# Patient Record
Sex: Female | Born: 1975
Health system: Southern US, Community
[De-identification: ages and names within clinical notes are randomized; demographics above are authoritative.]

## PROBLEM LIST (undated history)

## (undated) HISTORY — PX: TUBAL LIGATION: SHX77

## (undated) HISTORY — PX: LAPAROSCOPIC GASTRIC SLEEVE RESECTION: SHX5895

---

## 1998-05-10 ENCOUNTER — Encounter: Payer: Self-pay | Admitting: Emergency Medicine

## 1998-05-10 ENCOUNTER — Emergency Department (HOSPITAL_COMMUNITY): Admission: EM | Admit: 1998-05-10 | Discharge: 1998-05-10 | Payer: Self-pay

## 1998-05-12 ENCOUNTER — Encounter: Admission: RE | Admit: 1998-05-12 | Discharge: 1998-05-12 | Payer: Self-pay | Admitting: *Deleted

## 2013-12-21 ENCOUNTER — Encounter (HOSPITAL_BASED_OUTPATIENT_CLINIC_OR_DEPARTMENT_OTHER): Payer: Self-pay | Admitting: Emergency Medicine

## 2013-12-21 ENCOUNTER — Emergency Department (HOSPITAL_BASED_OUTPATIENT_CLINIC_OR_DEPARTMENT_OTHER)
Admission: EM | Admit: 2013-12-21 | Discharge: 2013-12-21 | Disposition: A | Discharge status: Home / Self Care | Payer: Medicaid Other | Attending: Emergency Medicine | Admitting: Emergency Medicine

## 2013-12-21 DIAGNOSIS — R51 Headache: Secondary | ICD-10-CM | POA: Insufficient documentation

## 2013-12-21 DIAGNOSIS — I10 Essential (primary) hypertension: Secondary | ICD-10-CM | POA: Diagnosis not present

## 2013-12-21 DIAGNOSIS — F411 Generalized anxiety disorder: Secondary | ICD-10-CM | POA: Insufficient documentation

## 2013-12-21 DIAGNOSIS — Z3202 Encounter for pregnancy test, result negative: Secondary | ICD-10-CM | POA: Insufficient documentation

## 2013-12-21 DIAGNOSIS — H5710 Ocular pain, unspecified eye: Secondary | ICD-10-CM | POA: Diagnosis not present

## 2013-12-21 DIAGNOSIS — R519 Headache, unspecified: Secondary | ICD-10-CM

## 2013-12-21 LAB — PREGNANCY, URINE: Preg Test, Ur: NEGATIVE

## 2013-12-21 MED ORDER — IBUPROFEN 800 MG PO TABS
800.0000 mg | ORAL_TABLET | Freq: Once | ORAL | Status: AC
  Administered 2013-12-21: 800 mg via ORAL
  Filled 2013-12-21: qty 1

## 2013-12-21 MED ORDER — KETOROLAC TROMETHAMINE 30 MG/ML IJ SOLN: 30.0000 mg | Freq: Once | INTRAMUSCULAR | Status: DC

## 2013-12-21 MED ORDER — IBUPROFEN 800 MG PO TABS: 800.0000 mg | ORAL_TABLET | Freq: Three times a day (TID) | ORAL | Status: DC | PRN

## 2013-12-21 MED ORDER — SODIUM CHLORIDE 0.9 % IV BOLUS (SEPSIS): 1000.0000 mL | Freq: Once | INTRAVENOUS | Status: DC

## 2013-12-21 MED ORDER — METOCLOPRAMIDE HCL 5 MG/ML IJ SOLN: 10.0000 mg | Freq: Once | INTRAMUSCULAR | Status: DC

## 2013-12-21 MED ORDER — DIPHENHYDRAMINE HCL 50 MG/ML IJ SOLN: 12.5000 mg | Freq: Once | INTRAMUSCULAR | Status: DC

## 2013-12-21 NOTE — Discharge Instructions (Signed)
Your blood pressure today may be elevated secondary to your pain. I do not feel you need to be started on blood pressure medication at this time. Recommend that you check your blood pressure when you are feeling well several times over the next 2 weeks and then followup with your primary care physician to discuss your blood pressure and whether or not you should be started on medication. If you develop worsening headache, sudden onset headache, numbness or weakness in your arms or legs, changes in your vision, headache with fever and neck pain and stiffness, please return to the emergency department.   Migraine Headache A migraine headache is an intense, throbbing pain on one or both sides of your head. A migraine can last for 30 minutes to several hours. CAUSES  The exact cause of a migraine headache is not always known. However, a migraine may be caused when nerves in the brain become irritated and release chemicals that cause inflammation. This causes pain. Certain things may also trigger migraines, such as:  Alcohol.  Smoking.  Stress.  Menstruation.  Aged cheeses.  Foods or drinks that contain nitrates, glutamate, aspartame, or tyramine.  Lack of sleep.  Chocolate.  Caffeine.  Hunger.  Physical exertion.  Fatigue.  Medicines used to treat chest pain (nitroglycerine), birth control pills, estrogen, and some blood pressure medicines. SIGNS AND SYMPTOMS  Pain on one or both sides of your head.  Pulsating or throbbing pain.  Severe pain that prevents daily activities.  Pain that is aggravated by any physical activity.  Nausea, vomiting, or both.  Dizziness.  Pain with exposure to bright lights, loud noises, or activity.  General sensitivity to bright lights, loud noises, or smells. Before you get a migraine, you may get warning signs that a migraine is coming (aura). An aura may include:  Seeing flashing lights.  Seeing bright spots, halos, or zig-zag  lines.  Having tunnel vision or blurred vision.  Having feelings of numbness or tingling.  Having trouble talking.  Having muscle weakness. DIAGNOSIS  A migraine headache is often diagnosed based on:  Symptoms.  Physical exam.  A CT scan or MRI of your head. These imaging tests cannot diagnose migraines, but they can help rule out other causes of headaches. TREATMENT Medicines may be given for pain and nausea. Medicines can also be given to help prevent recurrent migraines.  HOME CARE INSTRUCTIONS  Only take over-the-counter or prescription medicines for pain or discomfort as directed by your health care provider. The use of long-term narcotics is not recommended.  Lie down in a dark, quiet room when you have a migraine.  Keep a journal to find out what may trigger your migraine headaches. For example, write down:  What you eat and drink.  How much sleep you get.  Any change to your diet or medicines.  Limit alcohol consumption.  Quit smoking if you smoke.  Get 7 9 hours of sleep, or as recommended by your health care provider.  Limit stress.  Keep lights dim if bright lights bother you and make your migraines worse. SEEK IMMEDIATE MEDICAL CARE IF:   Your migraine becomes severe.  You have a fever.  You have a stiff neck.  You have vision loss.  You have muscular weakness or loss of muscle control.  You start losing your balance or have trouble walking.  You feel faint or pass out.  You have severe symptoms that are different from your first symptoms. MAKE SURE YOU:   Understand  these instructions.  Will watch your condition.  Will get help right away if you are not doing well or get worse. Document Released: 08/12/2005 Document Revised: 06/02/2013 Document Reviewed: 04/19/2013 [facility] Patient Information 2014 [place], [place].  Managing Your High Blood Pressure Blood pressure is a measurement of how forceful your blood is pressing against the  walls of the arteries. Arteries are muscular tubes within the circulatory system. Blood pressure does not stay the same. Blood pressure rises when you are active, excited, or nervous; and it lowers during sleep and relaxation. If the numbers measuring your blood pressure stay above normal most of the time, you are at risk for health problems. High blood pressure (hypertension) is a long-term (chronic) condition in which blood pressure is elevated. A blood pressure reading is recorded as two numbers, such as 120 over 80 (or 120/80). The first, higher number is called the systolic pressure. It is a measure of the pressure in your arteries as the heart beats. The second, lower number is called the diastolic pressure. It is a measure of the pressure in your arteries as the heart relaxes between beats.  Keeping your blood pressure in a normal range is important to your overall health and prevention of health problems, such as heart disease and stroke. When your blood pressure is uncontrolled, your heart has to work harder than normal. High blood pressure is a very common condition in adults because blood pressure tends to rise with age. Men and women are equally likely to have hypertension but at different times in life. Before age 36, men are more likely to have hypertension. After 36 years of age, women are more likely to have it. Hypertension is especially common in African Americans. This condition often has no signs or symptoms. The cause of the condition is usually not known. Your caregiver can help you come up with a plan to keep your blood pressure in a normal, healthy range. BLOOD PRESSURE STAGES Blood pressure is classified into four stages: normal, prehypertension, stage 1, and stage 2. Your blood pressure reading will be used to determine what type of treatment, if any, is necessary. Appropriate treatment options are tied to these four stages:  Normal  Systolic pressure (mm Hg): below 120.  Diastolic  pressure (mm Hg): below 80. Prehypertension  Systolic pressure (mm Hg): 120 to 139.  Diastolic pressure (mm Hg): 80 to 89. Stage1  Systolic pressure (mm Hg): 140 to 159.  Diastolic pressure (mm Hg): 90 to 99. Stage2  Systolic pressure (mm Hg): 160 or above.  Diastolic pressure (mm Hg): 100 or above. RISKS RELATED TO HIGH BLOOD PRESSURE Managing your blood pressure is an important responsibility. Uncontrolled high blood pressure can lead to:  A heart attack.  A stroke.  A weakened blood vessel (aneurysm).  Heart failure.  Kidney damage.  Eye damage.  Metabolic syndrome.  Memory and concentration problems. HOW TO MANAGE YOUR BLOOD PRESSURE Blood pressure can be managed effectively with lifestyle changes and medicines (if needed). Your caregiver will help you come up with a plan to bring your blood pressure within a normal range. Your plan should include the following: Education  Read all information provided by your caregivers about how to control blood pressure.  Educate yourself on the latest guidelines and treatment recommendations. New research is always being done to further define the risks and treatments for high blood pressure. Lifestylechanges  Control your weight.  Avoid smoking.  Stay physically active.  Reduce the amount of salt in  your diet.  Reduce stress.  Control any chronic conditions, such as high cholesterol or diabetes.  Reduce your alcohol intake. Medicines  Several medicines (antihypertensive medicines) are available, if needed, to bring blood pressure within a normal range. Communication  Review all the medicines you take with your caregiver because there may be side effects or interactions.  Talk with your caregiver about your diet, exercise habits, and other lifestyle factors that may be contributing to high blood pressure.  See your caregiver regularly. Your caregiver can help you create and adjust your plan for managing high  blood pressure. RECOMMENDATIONS FOR TREATMENT AND FOLLOW-UP  The following recommendations are based on current guidelines for managing high blood pressure in nonpregnant adults. Use these recommendations to identify the proper follow-up period or treatment option based on your blood pressure reading. You can discuss these options with your caregiver.  Systolic pressure of 120 to 139 or diastolic pressure of 80 to 89: Follow up with your caregiver as directed.  Systolic pressure of 140 to 160 or diastolic pressure of 90 to 100: Follow up with your caregiver within 2 months.  Systolic pressure above 160 or diastolic pressure above 100: Follow up with your caregiver within 1 month.  Systolic pressure above 180 or diastolic pressure above 110: Consider antihypertensive therapy; follow up with your caregiver within 1 week.  Systolic pressure above 200 or diastolic pressure above 120: Begin antihypertensive therapy; follow up with your caregiver within 1 week. Document Released: 05/06/2012 Document Reviewed: 05/06/2012 [facility] Patient Information 2014 [place], [place].    Emergency Department Resource Guide 1) Find a Doctor and Pay Out of Pocket Although you won't have to find out who is covered by your insurance plan, it is a good idea to ask around and get recommendations. You will then need to call the office and see if the doctor you have chosen will accept you as a new patient and what types of options they offer for patients who are self-pay. Some doctors offer discounts or will set up payment plans for their patients who do not have insurance, but you will need to ask so you aren't surprised when you get to your appointment.  2) Contact Your Local Health Department Not all health departments have doctors that can see patients for sick visits, but many do, so it is worth a call to see if yours does. If you don't know where your local health department is, you can check in your phone book. The  CDC also has a tool to help you locate your state's health department, and many state websites also have listings of all of their local health departments.  3) Find a Walk-in Clinic If your illness is not likely to be very severe or complicated, you may want to try a walk in clinic. These are popping up all over the country in pharmacies, drugstores, and shopping centers. They're usually staffed by nurse practitioners or physician assistants that have been trained to treat common illnesses and complaints. They're usually fairly quick and inexpensive. However, if you have serious medical issues or chronic medical problems, these are probably not your best option.  No Primary Care Doctor: - Call Health Connect at  [phone] - they can help you locate a primary care doctor that  accepts your insurance, provides certain services, etc. - Physician Referral Service- [phone]  Chronic Pain Problems: Organization         Address  Phone   Notes  [facility]  [phone] Patients need to be referred by their primary care doctor.   Medication Assistance: Organization         Address  Phone   Notes  [facility] Medication [facility] [address] [place], [place] [address] [phone]) [phone] --Must be a resident of [facility] -- Must have NO insurance coverage whatsoever (no Medicaid/ Medicare, etc.) -- The pt. MUST have a primary care doctor that directs their care regularly and follows them in the community   Med[facility]ist  [phone]   [employer]  [phone]    Agencies that provide inexpensive medical care: Organization         Address  Phone   Notes  [facility]  [phone]   [facility]    [phone]) [phone]   [facility] [address] [place], [place] [address] [phone]) [phone]   [facility] [address], [place] [phone]) [phone]   [facility]anned Parenthood    [phone](336)  [phone]   [facility]    [phone]) [phone]   [facility]  [address], [place] Phone:  [phone]) [phone], Fax:  [phone] Hours of Operation:  9 am - 6 pm, M-F.  Also accepts Medicaid/Medicare and self-pay.  [facility]  [address], [place] Phone: [phone], Fax: [phone]. Hours of Operation:  8:30 am - 5:30 pm, M-F.  Also accepts Medicaid and self-pay.  [facility] [place] [address], [place] Phone: [phone]) [phone]   [facility] [address], [place], [place] [phone], Ext. [phone] Mondays & Thursdays: 7-9 AM.  First 15 patients are seen on a first come, first serve basis.    Medicaid-accepting [facility] Providers:  Organization         Address  Phone   Notes  [facility] [address], [place] [phone] Also accepts self-pay patients.  [facility] [address] [place], [place]  [phone]   [facility] [address], [place] [phone]) [phone]   [facility] [address] [place] [address], [place] [phone]) [phone]   Renaye RakersVeita Bland [address], [place]   [phone]) [phone] Only accepts [place] Access [place] patients after they have their name applied to their card.   Self-Pay (no insurance) in [facility] [place]:  Organization         Address  Phone   Notes  Sickle Cell Patients, [facility] [address], [place] [phone]) [phone]   [facility] [address], [place] [phone]) [phone]   [facility]  [address], [place] [phone]   [facility]/Dr. Osei-Bonsu  [address] [place] [address], [place] or [phone], [place] [phone] Phone number for both [place] and [place] locations is the same.  [facility] [address], [place] [phone]   [facility] [address] [place] [address], [place] or [address] [phone]   [facility] [address], [place] [phone]) [phone], phone; [phone]) [phone], fax Sees patients 1st and 3rd Saturday of every month.  Must not qualify for public or private insurance (i.e. Medicaid, Medicare, [place] Health Choice, Veterans' Benefits)  Household income should be no more than 200% of the poverty level The clinic cannot treat you if you are pregnant or think you are pregnant  Sexually transmitted diseases are not treated at the clinic.    Dental Care: Organization         Address  Phone  Notes  [facility] of [facility] [address] [place], [place] [phone] Accepts children up to age 40 who are enrolled in [place] or [place] Health Choice; pregnant women with a Medicaid card; and children who have applied for Medicaid or [place] Health Choice, but were declined, whose parents can pay a reduced fee at time of service.  [facility] Department of [facility]  [address], [place] [phone] Accepts children up to age 68 who are enrolled in [place] or [place] Health Choice; pregnant women with a Medicaid card; and children who have applied for Medicaid or [place] Health Choice, but were declined, whose parents can pay a reduced fee at time of service.  [facility]  [address] [place], [place] [phone] Patients are seen by appointment only. Walk-ins are not accepted. [facility] will see patients 38 years of age and older. Monday - Tuesday (8am-5pm) Most Wednesdays (8:30-5pm) $30 per visit, cash only  [facility] Access [facility]  [address], [facility] [phone] Patients are seen by appointment only. Walk-ins are not accepted. [facility] will see patients 26 years of age and older. One  Wednesday Evening (Monthly: Volunteer Based).  $30 per visit, cash only  [employer]  [phone] for adults; Children under age 12, call Graduate Pediatric Dentistry at [phone]. Children aged 79-14, please call [phone] to request a pediatric application.  Dental services are provided in all areas of dental care including fillings, crowns and bridges, complete and partial dentures, implants, gum treatment, root canals, and extractions. Preventive care is also provided. Treatment is provided to both adults and children. Patients are selected via a lottery and there is often a waiting list.   [facility] [address], [place]  [phone] [email or web address]   [facility] [address] [place], [place] [phone], Ext. [phone] Second and Fourth Thursday of each month, opens at 6:30 AM; Clinic ends at 9 AM.  Patients are seen on a first-come first-served basis, and a limited number are seen during each clinic.   [facility]  [address] [place], [place] [phone]   Eligibility Requirements You must have lived in [place], [place], or [place] counties for at least the last three months.   You cannot be eligible for state or federal sponsored [place], including [employer], [place], or [employer].   You generally cannot be eligible for healthcare insurance through your employer.    How to apply: Eligibility screenings are held every Tuesday and Wednesday afternoon from 1:00 pm until 4:00 pm. You do not need an appointment for the interview!  [facility] [address], [place], [place] [phone]   [facility] Health Department  [phone]   [facility] Health Department  [phone]   [facility] Health Department  [phone]    Behavioral Health Resources in the Community: Intensive Outpatient Programs Organization          Address  Phone  Notes  [facility] [address] [place],  [place] [phone]   [facility] Outpatient [address] [place], [place] [phone]   [facility] [address], [place], [place]  [phone]   [facility] Mental Health [address],  [place], [place] [phone] or [phone]   Substance Abuse Resources Organization         Address  Phone  Notes  Alcohol [facility] Drug Services  [phone]   [facility]  [phone]   [facility]  [phone]   Floydene Flock  [phone]   Residential & Outpatient Substance Abuse Program  [phone]   Psychological Services Organization         Address  Phone  Notes  [facility] Behavioral Health  [phone]   [facility]  [phone]   [facility] Mental Health [address], [place] [phone] or [phone]    Mobile Crisis Teams Organization         Address  Phone  Notes  [facility], Mobile Crisis Care Unit  [phone]   [facility]  [address] [place], [place] [phone]   Doristine Locks [address] [place] [phone]    Self-Help/Support Groups Organization         Address  Phone             Notes  [facility] [place] - variety of support groups  [phone]- I7437963 Call for more information  Narcotics Anonymous (NA), [facility] [address], [employer] [place]  2 meetings at this location   Statistician         Address  Phone  Notes  [facility] [address],    [place]  [phone]   [facility]  [address] [place] [address]242, [place], [place] [phone]   [facility] [address] [place], [place] [phone] Admissions: 8am-3pm M-F  [facility] [address],    [place], [place] [phone]   [facility] [address] [place], [place], [place] [phone]   [facility] [address]  [place], [place] [phone]   [facility] - Intensive Outpatient [address], [place], [place] [phone]   [facility] [address] [place].,  [place], [place] [phone] or [phone]   [facility] ([facility]) [address], [place], [place] [phone] Accepts Medicaid  [facility] [address]  [place] [phone] Substance Abuse/Addiction Treatment   [facility] Organization         Address  Phone  Notes  [facility]  [phone]   Angie Fava, PhD [address] Kn[address] [place], [place]   [phone] or [phone]   [facility] Behavioral   [address] [place], [place] [phone]   [facility] [address] [place], [place] [phone] Insurance/Medicaid/sponsorship through [facility] and Families [address]                                    [place], [place] [phone] Therapy/tele-psych/case  [facility] [address], [place] [phone]    Dr. Lolly Mustache  [phone]   [facility] [place]  [employer] [facility] Dept. 1) [address] [place] 2) [address] [facility]  [address], [place] °3)  [address], [place] [phone] °[phone] ° °[phone]   °[place] Child Abuse Hotline (336) 342-1394 or (336) [phone] (After Hours)    ° ° °

## 2013-12-21 NOTE — ED Notes (Signed)
Headache x 4 days unrelieved with Tylenol.

## 2013-12-21 NOTE — ED Notes (Signed)
Pt refused IV and IV medication.  She is asking for "something by mouth" for her headache.  Pt is on the telephone while I am in the room.  EDP informed and new orders received.

## 2013-12-21 NOTE — ED Notes (Signed)
Discussed hypertension with pt and need to f/u with a family physician.  Pt verbalizes understanding of monitoring BP.

## 2013-12-21 NOTE — ED Provider Notes (Addendum)
TIME SEEN: 4:57 PM  CHIEF COMPLAINT: Headache  HPI: Patient is a 38 year old female with no significant past medical history presents emergency department with 4 days of headache. She describes it as a "straining" pain behind her eyes. She denies any aggravating or relieving factors. Denies any fevers, neck pain or neck stiffness, nausea, vomiting, numbness or focal weakness. No history of head injury. She states that she's been taking Tylenol without relief of her pain. She denies a history of chronic headaches. She states that she talked to her mother who told her to check her blood pressure. She went to a pharmacy and her blood pressure was 168/110 yesterday. Today in the emergency department her blood pressure is 163/97. She states she is very anxious about her blood pressure. She does not take medication for hypertension. She denies a sudden onset headache, thunderclap headache, no worst headache of her life.  ROS: See HPI Constitutional: no fever  Eyes: no drainage  ENT: no runny nose   Cardiovascular:  no chest pain  Resp: no SOB  GI: no vomiting GU: no dysuria Integumentary: no rash  Allergy: no hives  Musculoskeletal: no leg swelling  Neurological: no slurred speech ROS otherwise negative  PAST MEDICAL HISTORY/PAST SURGICAL HISTORY:  History reviewed. No pertinent past medical history.  MEDICATIONS:  Prior to Admission medications   Not on File    ALLERGIES:  Allergies  Allergen Reactions  . Codeine     hives  . Latex     rash    SOCIAL HISTORY:  History  Substance Use Topics  . Smoking status: Never Smoker   . Smokeless tobacco: Not on file  . Alcohol Use: No    FAMILY HISTORY: No family history on file.  EXAM: BP 163/97  Pulse 88  Temp(Src) 97.5 F (36.4 C) (Oral)  Resp 18  Ht 5\' 6"  (1.676 m)  Wt 325 lb (147.419 kg)  BMI 52.48 kg/m2  SpO2 98%  LMP 12/13/2013 CONSTITUTIONAL: Alert and oriented and responds appropriately to questions.  Well-appearing; well-nourished, in no apparent distress, talking on the cell phone when I walk in the room, laughing, pleasant HEAD: Normocephalic EYES: Conjunctivae clear, PERRL ENT: normal nose; no rhinorrhea; moist mucous membranes; pharynx without lesions noted NECK: Supple, no meningismus, no LAD  CARD: RRR; S1 and S2 appreciated; no murmurs, no clicks, no rubs, no gallops RESP: Normal chest excursion without splinting or tachypnea; breath sounds clear and equal bilaterally; no wheezes, no rhonchi, no rales,  ABD/GI: Normal bowel sounds; non-distended; soft, non-tender, no rebound, no guarding BACK:  The back appears normal and is non-tender to palpation, there is no CVA tenderness EXT: Normal ROM in all joints; non-tender to palpation; no edema; normal capillary refill; no cyanosis    SKIN: Normal color for age and race; warm NEURO: Moves all extremities equally, sensation to light-touch intact diffusely, cranial nerves II through XII intact PSYCH: The patient's mood and manner are appropriate. Grooming and personal hygiene are appropriate.  MEDICAL DECISION MAKING: Patient here with headache. Suspect that her hypertension is secondary to anxiety and pain. We'll treat with Toradol, Reglan, IV fluids and reassess. I am not concerned for intracranial hemorrhage, meningitis or encephalitis, cavernous sinus thrombosis or other life-threatening illness. Have discussed with patient that I recommend monitoring her blood pressure for the next several weeks while she is feeling well and keeping a journal of her blood pressures and then discussing this with a PCP to decide if she should be started on medication.  D/w patient that I am not concerned that her BP is that elevated today and because I do not know what her blood pressure is when she is feeling well, I do not feel this is safe to start her on antihypertensives at this time. Will give PCP followup information. Patient is comfortable with this  plan.  ED PROGRESS: Patient denies wanting an IV to nursing staff. She states she needs to be discharged to go to a "tuxedo shop". Given she is very well-appearing, neurologically intact, afebrile, will discharge patient home. We'll give ibuprofen in the emergency department and prescription for the same. We'll discharge with return precautions and PCP followup.     Layla MawKristen N Lilly Gasser, DO 12/21/13 1728  Layla MawKristen N Fowler Antos, DO 12/21/13 1731  Layla MawKristen N Arly Salminen, DO 12/21/13 1747

## 2014-12-28 ENCOUNTER — Encounter (HOSPITAL_BASED_OUTPATIENT_CLINIC_OR_DEPARTMENT_OTHER): Payer: Self-pay | Admitting: Emergency Medicine

## 2014-12-28 ENCOUNTER — Emergency Department (HOSPITAL_BASED_OUTPATIENT_CLINIC_OR_DEPARTMENT_OTHER)
Admission: EM | Admit: 2014-12-28 | Discharge: 2014-12-28 | Disposition: A | Discharge status: Home / Self Care | Payer: 59 | Attending: Emergency Medicine | Admitting: Emergency Medicine

## 2014-12-28 DIAGNOSIS — K529 Noninfective gastroenteritis and colitis, unspecified: Secondary | ICD-10-CM

## 2014-12-28 DIAGNOSIS — Z91040 Latex allergy status: Secondary | ICD-10-CM | POA: Insufficient documentation

## 2014-12-28 DIAGNOSIS — R197 Diarrhea, unspecified: Secondary | ICD-10-CM | POA: Diagnosis present

## 2014-12-28 DIAGNOSIS — E663 Overweight: Secondary | ICD-10-CM | POA: Diagnosis not present

## 2014-12-28 DIAGNOSIS — Z3202 Encounter for pregnancy test, result negative: Secondary | ICD-10-CM | POA: Diagnosis not present

## 2014-12-28 LAB — URINE MICROSCOPIC-ADD ON

## 2014-12-28 LAB — URINALYSIS, ROUTINE W REFLEX MICROSCOPIC
BILIRUBIN URINE: NEGATIVE
Glucose, UA: NEGATIVE mg/dL
KETONES UR: 15 mg/dL — AB
LEUKOCYTES UA: NEGATIVE
NITRITE: NEGATIVE
PH: 6 (ref 5.0–8.0)
PROTEIN: NEGATIVE mg/dL
Specific Gravity, Urine: 1.03 (ref 1.005–1.030)
UROBILINOGEN UA: 1 mg/dL (ref 0.0–1.0)

## 2014-12-28 LAB — PREGNANCY, URINE: PREG TEST UR: NEGATIVE

## 2014-12-28 MED ORDER — ONDANSETRON 4 MG PO TBDP: 4.0000 mg | ORAL_TABLET | Freq: Three times a day (TID) | ORAL | Status: DC | PRN

## 2014-12-28 MED ORDER — LOPERAMIDE HCL 2 MG PO CAPS: 2.0000 mg | ORAL_CAPSULE | ORAL | Status: DC | PRN

## 2014-12-28 MED ORDER — ONDANSETRON 4 MG PO TBDP
4.0000 mg | ORAL_TABLET | Freq: Once | ORAL | Status: AC
  Administered 2014-12-28: 4 mg via ORAL
  Filled 2014-12-28: qty 1

## 2014-12-28 MED ORDER — LOPERAMIDE HCL 2 MG PO CAPS
4.0000 mg | ORAL_CAPSULE | Freq: Once | ORAL | Status: AC
  Administered 2014-12-28: 4 mg via ORAL
  Filled 2014-12-28: qty 2

## 2014-12-28 NOTE — Discharge Instructions (Signed)

## 2014-12-28 NOTE — ED Notes (Signed)
Abd cramps, nausea, and diarrhea x2 days.  Daughter was diagnosed with a stomach virus here 5 days ago.

## 2014-12-28 NOTE — ED Provider Notes (Signed)
CSN: [id number]     Arrival date & time 12/28/14  0120 History   First MD Initiated Contact with Patient 12/28/14 0133     Chief Complaint  Patient presents with  . Diarrhea     (Consider location/radiation/quality/duration/timing/severity/associated sxs/prior Treatment) HPI  This is a 39 year old female who presents with 2 days of abdominal pain and diarrhea. Patient reports 2 days of epigastric crampy abdominal pain that is nonradiating. Currently her pain is 2 out of 10. It worsens prior to having a bowel movement. Patient reports nonbloody diarrhea and nausea. Denies any vomiting. Daughter was diagnosed with a stomach virus several days ago. Patient denies any fevers, chest pain, shortness of breath.  History reviewed. No pertinent past medical history. Past Surgical History  Procedure Laterality Date  . Cesarean section    . Tubal ligation     No family history on file. History  Substance Use Topics  . Smoking status: Never Smoker   . Smokeless tobacco: Not on file  . Alcohol Use: No   OB History    No data available     Review of Systems  Constitutional: Negative for fever.  Respiratory: Negative for cough, chest tightness and shortness of breath.   Cardiovascular: Negative for chest pain.  Gastrointestinal: Positive for nausea, abdominal pain and diarrhea. Negative for vomiting.  Genitourinary: Negative for dysuria.  Skin: Negative for wound.  Neurological: Negative for headaches.  All other systems reviewed and are negative.     Allergies  Codeine and Latex  Home Medications   Prior to Admission medications   Medication Sig Start Date End Date Taking? Authorizing Provider  ibuprofen (ADVIL,MOTRIN) 800 MG tablet Take 1 tablet (800 mg total) by mouth every 8 (eight) hours as needed for mild pain. 12/21/13   Kristen N Ward, DO  loperamide (IMODIUM) 2 MG capsule Take 1 capsule (2 mg total) by mouth as needed for diarrhea or loose stools. 12/28/14   Shon Batonourtney F  Darlis Wragg, MD  ondansetron (ZOFRAN-ODT) 4 MG disintegrating tablet Take 1 tablet (4 mg total) by mouth every 8 (eight) hours as needed for nausea or vomiting. 12/28/14   Shon Batonourtney F Vaneta Hammontree, MD   BP 149/87 mmHg  Pulse 70  Temp(Src) 98.2 F (36.8 C) (Oral)  Resp 20  Ht 5\' 6"  (1.676 m)  Wt 321 lb (145.605 kg)  BMI 51.84 kg/m2  SpO2 100%  LMP 12/12/2014 Physical Exam  Constitutional: She is oriented to person, place, and time. She appears well-developed and well-nourished.  Overweight  HENT:  Head: Normocephalic and atraumatic.  Eyes: Pupils are equal, round, and reactive to light.  Cardiovascular: Normal rate, regular rhythm and normal heart sounds.   Pulmonary/Chest: Effort normal and breath sounds normal. No respiratory distress. She has no wheezes.  Abdominal: Soft. Bowel sounds are normal. There is no tenderness. There is no rebound.  Neurological: She is alert and oriented to person, place, and time.  Skin: Skin is warm and dry.  Psychiatric: She has a normal mood and affect.  Nursing note and vitals reviewed.   ED Course  Procedures (including critical care time) Labs Review Labs Reviewed  URINALYSIS, ROUTINE W REFLEX MICROSCOPIC - Abnormal; Notable for the following:    APPearance CLOUDY (*)    Hgb urine dipstick TRACE (*)    Ketones, ur 15 (*)    All other components within normal limits  URINE MICROSCOPIC-ADD ON - Abnormal; Notable for the following:    Squamous Epithelial / LPF MANY (*)  Bacteria, UA MANY (*)    All other components within normal limits  PREGNANCY, URINE    Imaging Review No results found.   EKG Interpretation None      MDM   Final diagnoses:  Gastroenteritis    Patient presents with nausea, diarrhea, and abdominal pain. Nontoxic on exam. Abdominal exam benign. Given history and symptoms, suspect viral gastroenteritis. Urinalysis with 15 ketones but otherwise unremarkable. Patient given Imodium and Zofran ODT with improvement of her  symptoms. She was able to tolerate liquids prior to discharge. Discussed with patient supportive care at home. This time no indication for imaging. Patient follow-up with primary physician if symptoms do not improve.  After history, exam, and medical workup I feel the patient has been appropriately medically screened and is safe for discharge home. Pertinent diagnoses were discussed with the patient. Patient was given return precautions.   Shon Batonourtney F Ruel Dimmick, MD 12/28/14 [phone]

## 2015-01-04 ENCOUNTER — Emergency Department (HOSPITAL_BASED_OUTPATIENT_CLINIC_OR_DEPARTMENT_OTHER): Payer: 59

## 2015-01-04 ENCOUNTER — Emergency Department (HOSPITAL_BASED_OUTPATIENT_CLINIC_OR_DEPARTMENT_OTHER)
Admission: EM | Admit: 2015-01-04 | Discharge: 2015-01-04 | Disposition: A | Discharge status: Home / Self Care | Payer: 59 | Attending: Emergency Medicine | Admitting: Emergency Medicine

## 2015-01-04 ENCOUNTER — Encounter (HOSPITAL_BASED_OUTPATIENT_CLINIC_OR_DEPARTMENT_OTHER): Payer: Self-pay | Admitting: *Deleted

## 2015-01-04 DIAGNOSIS — E669 Obesity, unspecified: Secondary | ICD-10-CM | POA: Insufficient documentation

## 2015-01-04 DIAGNOSIS — Z91040 Latex allergy status: Secondary | ICD-10-CM | POA: Diagnosis not present

## 2015-01-04 DIAGNOSIS — Z79899 Other long term (current) drug therapy: Secondary | ICD-10-CM | POA: Insufficient documentation

## 2015-01-04 DIAGNOSIS — K5900 Constipation, unspecified: Secondary | ICD-10-CM

## 2015-01-04 NOTE — Discharge Instructions (Signed)
Call your neurosurgeon before you try any further medications for the constipation. I suggest that you do add in the vegetables that were suggested to your diet, this will help with your bowel movements.  Constipation Constipation is when a person has fewer than three bowel movements a week, has difficulty having a bowel movement, or has stools that are dry, hard, or larger than normal. As people grow older, constipation is more common. If you try to fix constipation with medicines that make you have a bowel movement (laxatives), the problem may get worse. Long-term laxative use may cause the muscles of the colon to become weak. A low-fiber diet, not taking in enough fluids, and taking certain medicines may make constipation worse.  CAUSES   Certain medicines, such as antidepressants, pain medicine, iron supplements, antacids, and water pills.   Certain diseases, such as diabetes, irritable bowel syndrome (IBS), thyroid disease, or depression.   Not drinking enough water.   Not eating enough fiber-rich foods.   Stress or travel.   Lack of physical activity or exercise.   Ignoring the urge to have a bowel movement.   Using laxatives too much.  SIGNS AND SYMPTOMS   Having fewer than three bowel movements a week.   Straining to have a bowel movement.   Having stools that are hard, dry, or larger than normal.   Feeling full or bloated.   Pain in the lower abdomen.   Not feeling relief after having a bowel movement.  DIAGNOSIS  Your health care provider will take a medical history and perform a physical exam. Further testing may be done for severe constipation. Some tests may include:  A barium enema X-ray to examine your rectum, colon, and, sometimes, your small intestine.   A sigmoidoscopy to examine your lower colon.   A colonoscopy to examine your entire colon. TREATMENT  Treatment will depend on the severity of your constipation and what is causing it. Some  dietary treatments include drinking more fluids and eating more fiber-rich foods. Lifestyle treatments may include regular exercise. If these diet and lifestyle recommendations do not help, your health care provider may recommend taking over-the-counter laxative medicines to help you have bowel movements. Prescription medicines may be prescribed if over-the-counter medicines do not work.  HOME CARE INSTRUCTIONS   Eat foods that have a lot of fiber, such as fruits, vegetables, whole grains, and beans.  Limit foods high in fat and processed sugars, such as french fries, hamburgers, cookies, candies, and soda.   A fiber supplement may be added to your diet if you cannot get enough fiber from foods.   Drink enough fluids to keep your urine clear or pale yellow.   Exercise regularly or as directed by your health care provider.   Go to the restroom when you have the urge to go. Do not hold it.   Only take over-the-counter or prescription medicines as directed by your health care provider. Do not take other medicines for constipation without talking to your health care provider first.  SEEK IMMEDIATE MEDICAL CARE IF:   You have bright red blood in your stool.   Your constipation lasts for more than 4 days or gets worse.   You have abdominal or rectal pain.   You have thin, pencil-like stools.   You have unexplained weight loss. MAKE SURE YOU:   Understand these instructions.  Will watch your condition.  Will get help right away if you are not doing well or get worse. Document Released:  05/10/2004 Document Revised: 08/17/2013 Document Reviewed: 05/24/2013 [facility] Patient Information 2015 [place], [place]. This information is not intended to replace advice given to you by your health care provider. Make sure you discuss any questions you have with your health care provider.

## 2015-01-04 NOTE — ED Notes (Signed)
C/o constipation x 1 week

## 2015-01-04 NOTE — ED Notes (Signed)
Pt c/o constipation x 1 week.

## 2015-01-04 NOTE — ED Provider Notes (Signed)
CSN: [id number]     Arrival date & time 01/04/15  1945 History   First MD Initiated Contact with Patient 01/04/15 2004     Chief Complaint  Patient presents with  . Constipation     (Consider location/radiation/quality/duration/timing/severity/associated sxs/prior Treatment) HPI Comments: 39 year old obese female complaining of constipation 1 week. States she was seen in the ED 1 week ago for diarrhea, and since then, has been "stopped up". Reports frequent issues with constipation due to the diet pill she takes. Recently, she was taken off of this diet pill, and is preparing [facility]. 2 days ago, she started on new nutritional shakes in preparation for the surgery. Since then, she's had some colicky abdominal cramping that is generalized throughout her abdomen, coming and going at random. Also reports she was told to increase her vegetable intake and to her diet, however has not yet done this. States she is eating more protein. Reports she had a very small bowel movement this morning. No nausea, vomiting, fever or chills. She tried suppositories with no relief along with Colace that was mixed with a laxative. She sent a message to the surgeon's office just prior to arriving to the ED.  Patient is a 39 y.o. female presenting with constipation. The history is provided by the patient.  Constipation Associated symptoms: abdominal pain     History reviewed. No pertinent past medical history. Past Surgical History  Procedure Laterality Date  . Cesarean section    . Tubal ligation     History reviewed. No pertinent family history. History  Substance Use Topics  . Smoking status: Never Smoker   . Smokeless tobacco: Not on file  . Alcohol Use: No   OB History    No data available     Review of Systems  Gastrointestinal: Positive for abdominal pain and constipation.  All other systems reviewed and are negative.     Allergies   Codeine and Latex  Home Medications   Prior to Admission medications   Medication Sig Start Date End Date Taking? Authorizing Provider  docusate sodium (COLACE) 100 MG capsule Take 100 mg by mouth 2 (two) times daily.   Yes Historical Provider, MD  ibuprofen (ADVIL,MOTRIN) 800 MG tablet Take 1 tablet (800 mg total) by mouth every 8 (eight) hours as needed for mild pain. 12/21/13   Kristen N Ward, DO  ondansetron (ZOFRAN-ODT) 4 MG disintegrating tablet Take 1 tablet (4 mg total) by mouth every 8 (eight) hours as needed for nausea or vomiting. 12/28/14   Shon Batonourtney F Horton, MD   BP 135/87 mmHg  Pulse 87  Temp(Src) 98.2 F (36.8 C) (Oral)  Resp 16  Wt 318 lb (144.244 kg)  SpO2 100%  LMP 12/12/2014 Physical Exam  Constitutional: She is oriented to person, place, and time. She appears well-developed and well-nourished. No distress.  Obese.  HENT:  Head: Normocephalic and atraumatic.  Mouth/Throat: Oropharynx is clear and moist.  Eyes: Conjunctivae and EOM are normal.  Neck: Normal range of motion. Neck supple.  Cardiovascular: Normal rate, regular rhythm and normal heart sounds.   Pulmonary/Chest: Effort normal and breath sounds normal. No respiratory distress.  Abdominal:  Mild, generalized abdominal tenderness. No rigidity, guarding or rebound. No peritoneal signs.  Musculoskeletal: Normal range of motion. She exhibits no edema.  Neurological: She is alert and oriented to person, place, and time. No sensory deficit.  Skin: Skin is warm and dry.  Psychiatric: She has a  normal mood and affect. Her behavior is normal.  Nursing note and vitals reviewed.   ED Course  Procedures (including critical care time) Labs Review Labs Reviewed - No data to display  Imaging Review No results found.   EKG Interpretation None      MDM   Final diagnoses:  Constipation, unspecified constipation type   Nontoxic appearing, NAD. Afebrile. VSS. Abdomen is soft with very mild, generalized  tenderness. No peritoneal signs. She had a small bowel movement this morning. No associated vomiting. Recently seen for diarrhea. Preparing for gastric sleeve procedure as stated above. X-ray was ordered by triage and obtained prior to my evaluation. I advised patient to discuss with her surgeon about specific laxatives that she should or should not be taking prior to surgery. I also advised her to include the vegetables that were suggested into her diet. Doubt bowel obstruction. Stable for discharge. Return precautions given. Patient states understanding of treatment care plan and is agreeable.  Kathrynn SpeedRobyn M Oluwaseyi Raffel, PA-C 01/04/15 2037  Zadie Rhineonald Wickline, MD 01/04/15 2041

## 2015-02-10 DIAGNOSIS — E46 Unspecified protein-calorie malnutrition: Secondary | ICD-10-CM | POA: Insufficient documentation

## 2015-02-10 DIAGNOSIS — Z9884 Bariatric surgery status: Secondary | ICD-10-CM | POA: Insufficient documentation

## 2015-03-24 ENCOUNTER — Emergency Department (HOSPITAL_BASED_OUTPATIENT_CLINIC_OR_DEPARTMENT_OTHER)
Admission: EM | Admit: 2015-03-24 | Discharge: 2015-03-24 | Disposition: A | Discharge status: Home / Self Care | Payer: 59 | Attending: Emergency Medicine | Admitting: Emergency Medicine

## 2015-03-24 ENCOUNTER — Encounter (HOSPITAL_BASED_OUTPATIENT_CLINIC_OR_DEPARTMENT_OTHER): Payer: Self-pay | Admitting: *Deleted

## 2015-03-24 DIAGNOSIS — Z79899 Other long term (current) drug therapy: Secondary | ICD-10-CM | POA: Insufficient documentation

## 2015-03-24 DIAGNOSIS — N390 Urinary tract infection, site not specified: Secondary | ICD-10-CM | POA: Diagnosis not present

## 2015-03-24 DIAGNOSIS — Z3202 Encounter for pregnancy test, result negative: Secondary | ICD-10-CM | POA: Insufficient documentation

## 2015-03-24 DIAGNOSIS — R1030 Lower abdominal pain, unspecified: Secondary | ICD-10-CM | POA: Diagnosis present

## 2015-03-24 DIAGNOSIS — Z91040 Latex allergy status: Secondary | ICD-10-CM | POA: Diagnosis not present

## 2015-03-24 LAB — URINALYSIS, ROUTINE W REFLEX MICROSCOPIC
Glucose, UA: NEGATIVE mg/dL
Nitrite: NEGATIVE
Protein, ur: NEGATIVE mg/dL
Specific Gravity, Urine: 1.027 (ref 1.005–1.030)
UROBILINOGEN UA: 1 mg/dL (ref 0.0–1.0)
pH: 6 (ref 5.0–8.0)

## 2015-03-24 LAB — URINE MICROSCOPIC-ADD ON

## 2015-03-24 LAB — PREGNANCY, URINE: Preg Test, Ur: NEGATIVE

## 2015-03-24 MED ORDER — PHENAZOPYRIDINE HCL 200 MG PO TABS: 200.0000 mg | ORAL_TABLET | Freq: Three times a day (TID) | ORAL | Status: DC

## 2015-03-24 MED ORDER — SULFAMETHOXAZOLE-TRIMETHOPRIM 800-160 MG PO TABS: 1.0000 | ORAL_TABLET | Freq: Two times a day (BID) | ORAL | Status: AC

## 2015-03-24 NOTE — Discharge Instructions (Signed)

## 2015-03-24 NOTE — ED Notes (Signed)
Patient has lower abdominal pain for the last two days with increase pain with urination.  States her urine has a strong foul order.

## 2015-03-24 NOTE — ED Provider Notes (Signed)
CSN: [id number]     Arrival date & time 03/24/15  1612 History   First MD Initiated Contact with Patient 03/24/15 1709     Chief Complaint  Patient presents with  . Abdominal Pain     (Consider location/radiation/quality/duration/timing/severity/associated sxs/prior Treatment) HPI Pressure with urination for 2 days. No flank pain. Urine has strong odor. No vaginal discharge or abnormal bleeding. No fever chills. History reviewed. No pertinent past medical history. Past Surgical History  Procedure Laterality Date  . Cesarean section    . Tubal ligation     No family history on file. History  Substance Use Topics  . Smoking status: Never Smoker   . Smokeless tobacco: Not on file  . Alcohol Use: No   OB History    No data available     Review of Systems 10 Systems reviewed and are negative for acute change except as noted in the HPI.    Allergies  Codeine and Latex  Home Medications   Prior to Admission medications   Medication Sig Start Date End Date Taking? Authorizing Provider  Ondansetron HCl (ZOFRAN PO) Take by mouth.   Yes Historical Provider, MD  RaNITidine HCl (ZANTAC PO) Take by mouth.   Yes Historical Provider, MD  Simethicone (GAS-X PO) Take by mouth.   Yes Historical Provider, MD  docusate sodium (COLACE) 100 MG capsule Take 100 mg by mouth 2 (two) times daily.    Historical Provider, MD  ibuprofen (ADVIL,MOTRIN) 800 MG tablet Take 1 tablet (800 mg total) by mouth every 8 (eight) hours as needed for mild pain. 12/21/13   Kristen N Ward, DO  ondansetron (ZOFRAN-ODT) 4 MG disintegrating tablet Take 1 tablet (4 mg total) by mouth every 8 (eight) hours as needed for nausea or vomiting. 12/28/14   Shon Baton, MD  phenazopyridine (PYRIDIUM) 200 MG tablet Take 1 tablet (200 mg total) by mouth 3 (three) times daily. 03/24/15   Arby Barrette, MD  sulfamethoxazole-trimethoprim (BACTRIM DS,SEPTRA DS) 800-160 MG per tablet Take 1 tablet by mouth 2 (two) times daily.  03/24/15 03/31/15  Arby Barrette, MD   BP 133/88 mmHg  Pulse 71  Temp(Src) 97.4 F (36.3 C) (Oral)  Resp 20  Wt 287 lb (130.182 kg)  SpO2 99%  LMP 03/13/2015 Physical Exam  Constitutional: She is oriented to person, place, and time. She appears well-developed and well-nourished.  HENT:  Head: Normocephalic and atraumatic.  Cardiovascular: Normal rate, regular rhythm, normal heart sounds and intact distal pulses.   Pulmonary/Chest: Effort normal and breath sounds normal.  Abdominal: Soft. Bowel sounds are normal. She exhibits no distension. There is tenderness (Mild suprapubic tenderness to palpation.).  Musculoskeletal: Normal range of motion. She exhibits no edema.  Neurological: She is alert and oriented to person, place, and time. She has normal strength. Coordination normal. GCS eye subscore is 4. GCS verbal subscore is 5. GCS motor subscore is 6.  Skin: Skin is warm, dry and intact.  Psychiatric: She has a normal mood and affect.    ED Course  Procedures (including critical care time) Labs Review Labs Reviewed  URINALYSIS, ROUTINE W REFLEX MICROSCOPIC (NOT AT [facility]) - Abnormal; Notable for the following:    APPearance CLOUDY (*)    Hgb urine dipstick MODERATE (*)    Bilirubin Urine SMALL (*)    Ketones, ur >80 (*)    Leukocytes, UA MODERATE (*)    All other components within normal limits  URINE MICROSCOPIC-ADD ON - Abnormal; Notable for the following:  Squamous Epithelial / LPF FEW (*)    Bacteria, UA MANY (*)    All other components within normal limits  URINE CULTURE  PREGNANCY, URINE    Imaging Review No results found.   EKG Interpretation None      MDM   Final diagnoses:  UTI (lower urinary tract infection)   Patient well appearance. Symptoms consistent UTI. No complaint of vaginal discharge or pelvic pain. Will be treated for UTI.    Arby Barrette, MD 03/24/15 [phone]

## 2015-03-27 LAB — URINE CULTURE

## 2015-03-29 ENCOUNTER — Telehealth: Payer: Self-pay | Admitting: *Deleted

## 2015-03-29 NOTE — ED Notes (Signed)
(+)  urine culture, treated with Sulfamethoxazole, OK per Christena Flake

## 2015-05-10 ENCOUNTER — Emergency Department (HOSPITAL_BASED_OUTPATIENT_CLINIC_OR_DEPARTMENT_OTHER)
Admission: EM | Admit: 2015-05-10 | Discharge: 2015-05-10 | Disposition: A | Discharge status: Home / Self Care | Payer: 59 | Attending: Emergency Medicine | Admitting: Emergency Medicine

## 2015-05-10 ENCOUNTER — Encounter (HOSPITAL_BASED_OUTPATIENT_CLINIC_OR_DEPARTMENT_OTHER): Payer: Self-pay

## 2015-05-10 DIAGNOSIS — Z79899 Other long term (current) drug therapy: Secondary | ICD-10-CM | POA: Insufficient documentation

## 2015-05-10 DIAGNOSIS — M542 Cervicalgia: Secondary | ICD-10-CM | POA: Diagnosis present

## 2015-05-10 DIAGNOSIS — Z91040 Latex allergy status: Secondary | ICD-10-CM | POA: Insufficient documentation

## 2015-05-10 DIAGNOSIS — M62838 Other muscle spasm: Secondary | ICD-10-CM | POA: Diagnosis not present

## 2015-05-10 MED ORDER — CYCLOBENZAPRINE HCL 10 MG PO TABS: 10.0000 mg | ORAL_TABLET | Freq: Two times a day (BID) | ORAL | Status: DC | PRN

## 2015-05-10 MED ORDER — ACETAMINOPHEN 325 MG PO TABS
ORAL_TABLET | ORAL | Status: AC
  Filled 2015-05-10: qty 2

## 2015-05-10 MED ORDER — ACETAMINOPHEN 325 MG PO TABS
650.0000 mg | ORAL_TABLET | Freq: Once | ORAL | Status: AC
  Administered 2015-05-10: 650 mg via ORAL

## 2015-05-10 NOTE — Discharge Instructions (Signed)

## 2015-05-10 NOTE — ED Provider Notes (Signed)
CSN: [id number]     Arrival date & time 05/10/15  0808 History   First MD Initiated Contact with Patient 05/10/15 0815     Chief Complaint  Patient presents with  . Neck Pain     (Consider location/radiation/quality/duration/timing/severity/associated sxs/prior Treatment) Patient is a 39 y.o. female presenting with neck pain. The history is provided by the patient.  Neck Pain Associated symptoms: no headaches, no numbness and no weakness    patient has right-sided neck pain. Began somewhat last night. She's been exercising due to recent weight loss surgery. No trauma. No numbness weakness. Has had pain going down the right arm is worse with movement. No headache. No confusion. No fevers. No fall. States she's been using exercise band to help with her exercising.  History reviewed. No pertinent past medical history. Past Surgical History  Procedure Laterality Date  . Cesarean section    . Tubal ligation    . Laparoscopic gastric sleeve resection     No family history on file. Social History  Substance Use Topics  . Smoking status: Never Smoker   . Smokeless tobacco: None  . Alcohol Use: No   OB History    No data available     Review of Systems  Constitutional: Negative for appetite change.  Respiratory: Negative for shortness of breath.   Musculoskeletal: Positive for neck pain. Negative for joint swelling and neck stiffness.  Neurological: Negative for weakness, numbness and headaches.  Hematological: Negative for adenopathy.      Allergies  Codeine; Latex; and Nsaids  Home Medications   Prior to Admission medications   Medication Sig Start Date End Date Taking? Authorizing Provider  dicyclomine (BENTYL) 10 MG capsule Take 10 mg by mouth 4 (four) times daily -  before meals and at bedtime.   Yes Historical Provider, MD  FIBER, CORN DEXTRIN, PO Take by mouth.   Yes Historical Provider, MD  RaNITidine HCl (ZANTAC PO) Take by mouth.   Yes Historical Provider, MD   sennosides-docusate sodium (SENOKOT-S) 8.6-50 MG tablet Take 1 tablet by mouth daily.   Yes Historical Provider, MD  ursodiol (ACTIGALL) 250 MG tablet Take 250 mg by mouth 3 (three) times daily.   Yes Historical Provider, MD  cyclobenzaprine (FLEXERIL) 10 MG tablet Take 1 tablet (10 mg total) by mouth 2 (two) times daily as needed for muscle spasms. 05/10/15   Benjiman Core, MD  docusate sodium (COLACE) 100 MG capsule Take 100 mg by mouth 2 (two) times daily.    Historical Provider, MD  ibuprofen (ADVIL,MOTRIN) 800 MG tablet Take 1 tablet (800 mg total) by mouth every 8 (eight) hours as needed for mild pain. 12/21/13   Kristen N Ward, DO  ondansetron (ZOFRAN-ODT) 4 MG disintegrating tablet Take 1 tablet (4 mg total) by mouth every 8 (eight) hours as needed for nausea or vomiting. 12/28/14   Shon Baton, MD  Ondansetron HCl (ZOFRAN PO) Take by mouth.    Historical Provider, MD  phenazopyridine (PYRIDIUM) 200 MG tablet Take 1 tablet (200 mg total) by mouth 3 (three) times daily. 03/24/15   Arby Barrette, MD  Simethicone (GAS-X PO) Take by mouth.    Historical Provider, MD   BP 132/88 mmHg  Pulse 79  Temp(Src) 97.5 F (36.4 C) (Oral)  Resp 20  Ht  (1.676 m)  Wt 269 lb 9.6 oz (122.29 kg)  BMI 43.54 kg/m2  SpO2 99%  LMP 05/04/2015 Physical Exam  Constitutional: She appears well-developed and well-nourished.  HENT:  Head: Atraumatic.  Neck:  Tenderness with some muscle spasm over right trapezius. Neurovascular intact over right hand. No midline cervical tenderness. Somewhat decreased range of motion of neck.  Cardiovascular: Normal rate.   Pulmonary/Chest: Effort normal.  Musculoskeletal: She exhibits tenderness.  Tenderness over trapezius on right side.  Neurological: She is alert.    ED Course  Procedures (including critical care time) Labs Review Labs Reviewed - No data to display  Imaging Review No results found. I have personally reviewed and evaluated these images  and lab results as part of my medical decision-making.   EKG Interpretation None      MDM   Final diagnoses:  Trapezius muscle spasm    Patient with muscle spasm on right side. Apparent previous. No neurologic deficits. Will have patient take Flexeril and Tylenol. No NSAIDs due to gastric surgery.    Benjiman Core, MD 05/10/15 [phone]

## 2015-05-10 NOTE — ED Notes (Signed)
Report woke up this am with pain in neck.  Pt having difficulty turning head due to pain. Reports has been working out but doesn't thing she injured anything.

## 2015-07-25 ENCOUNTER — Encounter (HOSPITAL_BASED_OUTPATIENT_CLINIC_OR_DEPARTMENT_OTHER): Payer: Self-pay | Admitting: Emergency Medicine

## 2015-07-25 ENCOUNTER — Emergency Department (HOSPITAL_BASED_OUTPATIENT_CLINIC_OR_DEPARTMENT_OTHER)
Admission: EM | Admit: 2015-07-25 | Discharge: 2015-07-26 | Disposition: A | Discharge status: Home / Self Care | Payer: 59 | Attending: Emergency Medicine | Admitting: Emergency Medicine

## 2015-07-25 DIAGNOSIS — L299 Pruritus, unspecified: Secondary | ICD-10-CM | POA: Diagnosis not present

## 2015-07-25 DIAGNOSIS — M79642 Pain in left hand: Secondary | ICD-10-CM | POA: Insufficient documentation

## 2015-07-25 DIAGNOSIS — Z91040 Latex allergy status: Secondary | ICD-10-CM | POA: Diagnosis not present

## 2015-07-25 DIAGNOSIS — Z792 Long term (current) use of antibiotics: Secondary | ICD-10-CM | POA: Diagnosis not present

## 2015-07-25 DIAGNOSIS — Z79899 Other long term (current) drug therapy: Secondary | ICD-10-CM | POA: Diagnosis not present

## 2015-07-25 DIAGNOSIS — L989 Disorder of the skin and subcutaneous tissue, unspecified: Secondary | ICD-10-CM | POA: Insufficient documentation

## 2015-07-25 NOTE — ED Notes (Signed)
Pt c/o soreness to area on left palm. Pt noted to have a small raised area that is bluish in color on left palm. Area is tender to palpation.

## 2015-07-25 NOTE — ED Provider Notes (Signed)
CSN: [id number]     Arrival date & time 07/25/15  2337 History  By signing my name below, I, Kristina Gonzalez, attest that this documentation has been prepared under the direction and in the presence of Shon Batonourtney F Horton, MD. Electronically Signed: Phillis HaggisGabriella Gonzalez, ED Scribe. 07/25/2015. 11:57 PM.  Chief Complaint  Patient presents with  . Hand Problem   The history is provided by the patient. No language interpreter was used.  HPI Comments: Kristina Gonzalez is a 39 y.o. female with no significant medical hx who presents to the Emergency Department complaining of gradually worsening, constant left palm pain onset 5 days ago. Pt states that she was seen last Thursday in the ED and noticed a "knot" to her left palm that is tender and blue in color. She reports associated itching to the area. Pt is right hand dominant. She reports taking ibuprofen to no relief. She denies injury to the area, fever, chills, numbness, or weakness.  History reviewed. No pertinent past medical history. Past Surgical History  Procedure Laterality Date  . Cesarean section    . Tubal ligation    . Laparoscopic gastric sleeve resection     No family history on file. Social History  Substance Use Topics  . Smoking status: Never Smoker   . Smokeless tobacco: None  . Alcohol Use: No   OB History    No data available     Review of Systems  Constitutional: Negative for fever.  Skin: Positive for color change.  Neurological: Negative for weakness and numbness.  All other systems reviewed and are negative.  Allergies  Codeine; Latex; and Nsaids  Home Medications   Prior to Admission medications   Medication Sig Start Date End Date Taking? Authorizing Provider  cyclobenzaprine (FLEXERIL) 10 MG tablet Take 1 tablet (10 mg total) by mouth 2 (two) times daily as needed for muscle spasms. 05/10/15   Benjiman CoreNathan Pickering, MD  dicyclomine (BENTYL) 10 MG capsule Take 10 mg by mouth 4 (four) times daily -  before meals and at  bedtime.    Historical Provider, MD  docusate sodium (COLACE) 100 MG capsule Take 100 mg by mouth 2 (two) times daily.    Historical Provider, MD  FIBER, CORN DEXTRIN, PO Take by mouth.    Historical Provider, MD  ibuprofen (ADVIL,MOTRIN) 800 MG tablet Take 1 tablet (800 mg total) by mouth every 8 (eight) hours as needed for mild pain. 12/21/13   Kristen N Ward, DO  ondansetron (ZOFRAN-ODT) 4 MG disintegrating tablet Take 1 tablet (4 mg total) by mouth every 8 (eight) hours as needed for nausea or vomiting. 12/28/14   Shon Batonourtney F Horton, MD  Ondansetron HCl (ZOFRAN PO) Take by mouth.    Historical Provider, MD  phenazopyridine (PYRIDIUM) 200 MG tablet Take 1 tablet (200 mg total) by mouth 3 (three) times daily. 03/24/15   Arby BarretteMarcy Pfeiffer, MD  RaNITidine HCl (ZANTAC PO) Take by mouth.    Historical Provider, MD  sennosides-docusate sodium (SENOKOT-S) 8.6-50 MG tablet Take 1 tablet by mouth daily.    Historical Provider, MD  Simethicone (GAS-X PO) Take by mouth.    Historical Provider, MD  ursodiol (ACTIGALL) 250 MG tablet Take 250 mg by mouth 3 (three) times daily.    Historical Provider, MD   BP 125/78 mmHg  Pulse 74  Temp(Src) 98 F (36.7 C) (Oral)  Resp 18  Ht 5\' 6"  (1.676 m)  Wt 234 lb (106.142 kg)  BMI 37.79 kg/m2  SpO2 100% Physical Exam  Constitutional: She is oriented to person, place, and time. She appears well-developed and well-nourished.  HENT:  Head: Normocephalic and atraumatic.  Cardiovascular: Normal rate and regular rhythm.   Pulmonary/Chest: Effort normal. No respiratory distress.  Musculoskeletal:  Focused examination of the left palm reveals a less than 0.5 centimeter raised lesion over the center of the palm, it is tender to palpation, no significant erythema, bluish in discoloration, normal flexion and extension of fingers, 2+ radial pulse  Neurological: She is alert and oriented to person, place, and time.  Skin: Skin is warm and dry.  Psychiatric: She has a normal mood  and affect.  Nursing note and vitals reviewed.   ED Course  Procedures (including critical care time) DIAGNOSTIC STUDIES: Oxygen Saturation is 100% on RA, normal by my interpretation.    COORDINATION OF CARE: 11:56 PM-Discussed treatment plan which includes US of the area with pt at bedside and pt agreed to plan.   Labs Review Labs Reviewed - No data to display  Imaging Review No results found. I have personally reviewed and evaluated these images and lab results as part of my medical decision-making.   EKG Interpretation None      MDM   Final diagnoses:  Hand pain, left    Patient presents with left hand pain. A very small knot was noted on the palmar aspect of the left hand. Quick bedside ultrasound does not show a drainable abscess. This could be early abscess versus contusion. Discuss with patient conservative management home. She was given signs and symptoms of progressing abscess or infection and was given strict return precautions.  After history, exam, and medical workup I feel the patient has been appropriately medically screened and is safe for discharge home. Pertinent diagnoses were discussed with the patient. Patient was given return precautions.  I personally performed the services described in this documentation, which was scribed in my presence. The recorded information has been reviewed and is accurate.   Shon Baton, MD 07/26/15 [phone]

## 2015-07-26 NOTE — Discharge Instructions (Signed)
You were seen today for a knot on your left hand. At this time it is very small and difficult to characterize. There is no drainable abscess. It may be a bruise or broken blood vessel. Continue to monitor. If it becomes red or increases in size it needs to be reevaluated.

## 2016-07-05 ENCOUNTER — Emergency Department (HOSPITAL_BASED_OUTPATIENT_CLINIC_OR_DEPARTMENT_OTHER)
Admission: EM | Admit: 2016-07-05 | Discharge: 2016-07-05 | Disposition: A | Discharge status: Home / Self Care | Payer: 59 | Attending: Emergency Medicine | Admitting: Emergency Medicine

## 2016-07-05 ENCOUNTER — Encounter (HOSPITAL_BASED_OUTPATIENT_CLINIC_OR_DEPARTMENT_OTHER): Payer: Self-pay | Admitting: *Deleted

## 2016-07-05 DIAGNOSIS — Y92410 Unspecified street and highway as the place of occurrence of the external cause: Secondary | ICD-10-CM | POA: Insufficient documentation

## 2016-07-05 DIAGNOSIS — Z91040 Latex allergy status: Secondary | ICD-10-CM | POA: Insufficient documentation

## 2016-07-05 DIAGNOSIS — S199XXA Unspecified injury of neck, initial encounter: Secondary | ICD-10-CM | POA: Diagnosis present

## 2016-07-05 DIAGNOSIS — Y939 Activity, unspecified: Secondary | ICD-10-CM | POA: Diagnosis not present

## 2016-07-05 DIAGNOSIS — Y999 Unspecified external cause status: Secondary | ICD-10-CM | POA: Diagnosis not present

## 2016-07-05 DIAGNOSIS — Z791 Long term (current) use of non-steroidal anti-inflammatories (NSAID): Secondary | ICD-10-CM | POA: Diagnosis not present

## 2016-07-05 DIAGNOSIS — Z79899 Other long term (current) drug therapy: Secondary | ICD-10-CM | POA: Diagnosis not present

## 2016-07-05 DIAGNOSIS — M542 Cervicalgia: Secondary | ICD-10-CM | POA: Diagnosis not present

## 2016-07-05 NOTE — ED Notes (Signed)
Patient was seen treated and assessed by the EDP
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
## 2016-07-05 NOTE — Discharge Instructions (Signed)
You may take 1000 mg Tylenol every 6 hours as needed for pain.  Ice your neck for 10-20 minutes three times daily.  Return to the ED if you experience any numbness, weakness, severe neck pain, or any new or concerning symptoms.

## 2017-08-02 ENCOUNTER — Emergency Department (HOSPITAL_BASED_OUTPATIENT_CLINIC_OR_DEPARTMENT_OTHER)
Admission: EM | Admit: 2017-08-02 | Discharge: 2017-08-02 | Disposition: A | Discharge status: Home / Self Care | Payer: 59 | Attending: Emergency Medicine | Admitting: Emergency Medicine

## 2017-08-02 ENCOUNTER — Other Ambulatory Visit: Payer: Self-pay

## 2017-08-02 ENCOUNTER — Encounter (HOSPITAL_BASED_OUTPATIENT_CLINIC_OR_DEPARTMENT_OTHER): Payer: Self-pay | Admitting: Emergency Medicine

## 2017-08-02 DIAGNOSIS — N3090 Cystitis, unspecified without hematuria: Secondary | ICD-10-CM | POA: Diagnosis not present

## 2017-08-02 DIAGNOSIS — Z91040 Latex allergy status: Secondary | ICD-10-CM | POA: Insufficient documentation

## 2017-08-02 DIAGNOSIS — R350 Frequency of micturition: Secondary | ICD-10-CM | POA: Insufficient documentation

## 2017-08-02 DIAGNOSIS — R3915 Urgency of urination: Secondary | ICD-10-CM | POA: Insufficient documentation

## 2017-08-02 DIAGNOSIS — R1030 Lower abdominal pain, unspecified: Secondary | ICD-10-CM | POA: Diagnosis present

## 2017-08-02 LAB — URINALYSIS, ROUTINE W REFLEX MICROSCOPIC
Bilirubin Urine: NEGATIVE
Glucose, UA: NEGATIVE mg/dL
Ketones, ur: 15 mg/dL — AB
Nitrite: NEGATIVE
PH: 6.5 (ref 5.0–8.0)
Protein, ur: 30 mg/dL — AB
SPECIFIC GRAVITY, URINE: 1.025 (ref 1.005–1.030)

## 2017-08-02 LAB — URINALYSIS, MICROSCOPIC (REFLEX)

## 2017-08-02 MED ORDER — FLUCONAZOLE 150 MG PO TABS: 150.0000 mg | ORAL_TABLET | Freq: Every day | ORAL | 0 refills | Status: DC

## 2017-08-02 MED ORDER — PHENAZOPYRIDINE HCL 200 MG PO TABS: 200.0000 mg | ORAL_TABLET | Freq: Three times a day (TID) | ORAL | 0 refills | Status: DC

## 2017-08-02 MED ORDER — NITROFURANTOIN MONOHYD MACRO 100 MG PO CAPS: 100.0000 mg | ORAL_CAPSULE | Freq: Two times a day (BID) | ORAL | 0 refills | Status: DC

## 2017-08-02 MED ORDER — FLUCONAZOLE 50 MG PO TABS: 150.0000 mg | ORAL_TABLET | Freq: Once | ORAL | Status: DC

## 2017-08-02 NOTE — ED Triage Notes (Signed)
Dysuria and foul smelling urine x 4 days.

## 2017-08-02 NOTE — ED Provider Notes (Signed)
[facility] EMERGENCY DEPARTMENT Provider Note   CSN: [id number] Arrival date & time: 08/02/17  1405     History   Chief Complaint Chief Complaint  Patient presents with  . Dysuria    HPI Kristina Gonzalez is a 41 y.o. female.  Patient presents with complaint of suprapubic pressure, increased frequency and urgency over the past 3 days.  No significant dysuria or hematuria.  No fevers, flank pain, vomiting.  Symptoms are similar to previous urinary tract infection.  Patient denies vaginal bleeding or discharge.  She notes odor to urine.  No treatments prior to arrival. The onset of this condition was acute. The course is constant. Aggravating factors: none. Alleviating factors: none.        History reviewed. No pertinent past medical history.  There are no active problems to display for this patient.   Past Surgical History:  Procedure Laterality Date  . CESAREAN SECTION    . LAPAROSCOPIC GASTRIC SLEEVE RESECTION    . TUBAL LIGATION      OB History    No data available       Home Medications    Prior to Admission medications   Medication Sig Start Date End Date Taking? Authorizing Provider  docusate sodium (COLACE) 100 MG capsule Take 100 mg by mouth 2 (two) times daily.    [provider]  FIBER, CORN DEXTRIN, PO Take by mouth.    [provider]  nitrofurantoin, macrocrystal-monohydrate, (MACROBID) 100 MG capsule Take 1 capsule (100 mg total) by mouth 2 (two) times daily. 08/02/17   Renne CriglerGeiple, Skarlett Sedlacek, PA-C  Ondansetron HCl (ZOFRAN PO) Take by mouth.    [provider]  phenazopyridine (PYRIDIUM) 200 MG tablet Take 1 tablet (200 mg total) by mouth 3 (three) times daily. 08/02/17   Renne CriglerGeiple, Mallisa [place]a, PA-C    Family History No family history on file.  Social History Social History   Tobacco Use  . Smoking status: Never Smoker  . Smokeless tobacco: Never Used  Substance Use Topics  . Alcohol use: No  . Drug use: No      Allergies   Codeine; Latex; and Nsaids   Review of Systems Review of Systems  Constitutional: Negative for fever.  HENT: Negative for rhinorrhea and sore throat.   Eyes: Negative for redness.  Respiratory: Negative for cough.   Cardiovascular: Negative for chest pain.  Gastrointestinal: Negative for abdominal pain, diarrhea, nausea and vomiting.  Genitourinary: Positive for frequency and urgency. Negative for dysuria, hematuria, menstrual problem, vaginal bleeding and vaginal discharge.  Musculoskeletal: Negative for myalgias.  Skin: Negative for rash.  Neurological: Negative for headaches.     Physical Exam Updated Vital Signs BP 125/80 (BP Location: Right Arm)   Pulse 72   Temp 97.8 F (36.6 C) (Oral)   Resp 18   Ht 5\' 6"  (1.676 m)   Wt 95.3 kg (210 lb)   LMP 07/17/2017   SpO2 100%   BMI 33.89 kg/m   Physical Exam  Constitutional: She appears well-developed and well-nourished.  HENT:  Head: Normocephalic and atraumatic.  Eyes: Conjunctivae are normal. Right eye exhibits no discharge. Left eye exhibits no discharge.  Neck: Normal range of motion. Neck supple.  Cardiovascular: Normal rate, regular rhythm and normal heart sounds.  Pulmonary/Chest: Effort normal and breath sounds normal. No stridor. No respiratory distress. She has no wheezes.  Abdominal: Soft. There is no tenderness.  Suprapubic pressure  Neurological: She is alert.  Skin: Skin is warm and dry.  Psychiatric:  She has a normal mood and affect.  Nursing note and vitals reviewed.    ED Treatments / Results  Labs (all labs ordered are listed, but only abnormal results are displayed) Labs Reviewed  URINALYSIS, ROUTINE W REFLEX MICROSCOPIC - Abnormal; Notable for the following components:      Result Value   Color, Urine STRAW (*)    APPearance CLOUDY (*)    Hgb urine dipstick MODERATE (*)    Ketones, ur 15 (*)    Protein, ur 30 (*)    Leukocytes, UA SMALL (*)    All other components  within normal limits  URINALYSIS, MICROSCOPIC (REFLEX) - Abnormal; Notable for the following components:   Bacteria, UA MANY (*)    Squamous Epithelial / LPF 6-30 (*)    All other components within normal limits    Procedures Procedures (including critical care time)  Medications Ordered in ED Medications - No data to display   Initial Impression / Assessment and Plan / ED Course  I have reviewed the triage vital signs and the nursing notes.  Pertinent labs & imaging results that were available during my care of the patient were reviewed by me and considered in my medical decision making (see chart for details).     Patient seen and examined.   Vital signs reviewed and are as follows: BP 125/80 (BP Location: Right Arm)   Pulse 72   Temp 97.8 F (36.6 C) (Oral)   Resp 18   Ht 5\' 6"  (1.676 m)   Wt 95.3 kg (210 lb)   LMP 07/17/2017   SpO2 100%   BMI 33.89 kg/m   Will treat UTI with Macrobid, Pyridium.  Patient given a prescription for Diflucan given history of yeast infections with antibiotics.  Encouraged to return to the emergency department with fever, vomiting, worsening pain or other concerns.  She verbalizes understanding and agrees with plan.  Final Clinical Impressions(s) / ED Diagnoses   Final diagnoses:  Cystitis   Patient with irritative UTI symptoms, UA demonstrating infection.  No vaginal discharge.  Low suspicion for STI at this time.  No signs of pyelonephritis.  ED Discharge Orders        Ordered    nitrofurantoin, macrocrystal-monohydrate, (MACROBID) 100 MG capsule  2 times daily     08/02/17 1614    phenazopyridine (PYRIDIUM) 200 MG tablet  3 times daily     08/02/17 1614    fluconazole (DIFLUCAN) 150 MG tablet  Daily     08/02/17 1625       Renne CriglerGeiple, Chandlor Noecker, PA-C 08/02/17 1704    Tilden Fossaees, Elizabeth, MD 08/03/17 [phone]

## 2017-08-02 NOTE — Discharge Instructions (Signed)
Please read and follow all provided instructions.  Your diagnoses today include:  1. Cystitis     Tests performed today include:  Urine test - suggests that you have an infection in your bladder  Vital signs. See below for your results today.   Medications prescribed:   Macrobid - antibiotic for urinary tract infection  You have been prescribed an antibiotic medicine: take the entire course of medicine even if you are feeling better. Stopping early can cause the antibiotic not to work.   Pyridium - medication for urinary tract infection symptoms.   This medication will turn your urine orange. This is normal.   Home care instructions:  Follow any educational materials contained in this packet.  Follow-up instructions: Please follow-up with your primary care provider in 3 days if symptoms are not resolved for further evaluation of your symptoms.  Return instructions:   Please return to the Emergency Department if you experience worsening symptoms.   Return with fever, worsening pain, persistent vomiting, worsening pain in your back.   Please return if you have any other emergent concerns.  Additional Information:  Your vital signs today were: BP 125/80 (BP Location: Right Arm)    Pulse 72    Temp 97.8 F (36.6 C) (Oral)    Resp 18    Ht 5\' 6"  (1.676 m)    Wt 95.3 kg (210 lb)    LMP 07/17/2017    SpO2 100%    BMI 33.89 kg/m  If your blood pressure (BP) was elevated above 135/85 this visit, please have this repeated by your doctor within one month. --------------

## 2017-08-02 NOTE — ED Notes (Signed)
Pt given d/c instructions as per chart. Rx x 3. Verbalizes understanding. No questions. 

## 2017-08-02 NOTE — ED Notes (Signed)
Pt describes pressure with urination x 4 days. Urine does have an odor. Denies discharge or other s/s.

## 2017-09-24 ENCOUNTER — Other Ambulatory Visit: Payer: Self-pay

## 2017-09-24 ENCOUNTER — Emergency Department (HOSPITAL_BASED_OUTPATIENT_CLINIC_OR_DEPARTMENT_OTHER)
Admission: EM | Admit: 2017-09-24 | Discharge: 2017-09-24 | Disposition: A | Discharge status: Home / Self Care | Payer: 59 | Attending: Emergency Medicine | Admitting: Emergency Medicine

## 2017-09-24 ENCOUNTER — Emergency Department (HOSPITAL_BASED_OUTPATIENT_CLINIC_OR_DEPARTMENT_OTHER): Payer: 59

## 2017-09-24 ENCOUNTER — Encounter (HOSPITAL_BASED_OUTPATIENT_CLINIC_OR_DEPARTMENT_OTHER): Payer: Self-pay | Admitting: Emergency Medicine

## 2017-09-24 DIAGNOSIS — Y998 Other external cause status: Secondary | ICD-10-CM | POA: Insufficient documentation

## 2017-09-24 DIAGNOSIS — S8992XA Unspecified injury of left lower leg, initial encounter: Secondary | ICD-10-CM | POA: Diagnosis not present

## 2017-09-24 DIAGNOSIS — Z91040 Latex allergy status: Secondary | ICD-10-CM | POA: Insufficient documentation

## 2017-09-24 DIAGNOSIS — W19XXXA Unspecified fall, initial encounter: Secondary | ICD-10-CM

## 2017-09-24 DIAGNOSIS — M25562 Pain in left knee: Secondary | ICD-10-CM | POA: Insufficient documentation

## 2017-09-24 DIAGNOSIS — Y92481 Parking lot as the place of occurrence of the external cause: Secondary | ICD-10-CM | POA: Diagnosis not present

## 2017-09-24 DIAGNOSIS — M25561 Pain in right knee: Secondary | ICD-10-CM | POA: Diagnosis not present

## 2017-09-24 DIAGNOSIS — Y9301 Activity, walking, marching and hiking: Secondary | ICD-10-CM | POA: Diagnosis not present

## 2017-09-24 DIAGNOSIS — W010XXA Fall on same level from slipping, tripping and stumbling without subsequent striking against object, initial encounter: Secondary | ICD-10-CM | POA: Diagnosis not present

## 2017-09-24 NOTE — ED Triage Notes (Signed)
Pt fell on ice in a parking lot.  Pt c/o bilateral knee pain.  No head injury or LOC

## 2017-09-24 NOTE — Discharge Instructions (Signed)
1. Medications: Take Tylenol as needed for pain control with nausea medicine, usual home medications 2. Treatment: rest, ice, elevate and use brace, drink plenty of fluids, hot baths and hot showers to relax muscles, gentle stretching 3. Follow Up: Please followup with orthopedics as directed or your PCP in 1 week if no improvement for discussion of your diagnoses and further evaluation after today's visit; Please return to the ER for worsening symptoms or other concerns such as fevers, severe swelling or redness of the joints, or weakness.

## 2017-09-24 NOTE — ED Provider Notes (Signed)
[facility] EMERGENCY DEPARTMENT Provider Note   CSN: [id number] Arrival date & time: 09/24/17  1039     History   Chief Complaint Chief Complaint  Patient presents with  . Knee Pain    HPI Kristina Gonzalez is a 42 y.o. female with history of gastric sleeve surgery presents today with chief complaint acute onset, constant knee pain bilaterally secondary to fall earlier today.  She states that at around 10:15 AM she was walking into a store when she slipped on a patch of ice on the parking lot.  She states that she initially landed on her right knee, followed by her left knee, and then fell backwards onto her buttocks.  She denies head injury or loss of consciousness.  She endorses very mild aching buttock pain and mild throbbing left knee pain but her primary complaint is more severe sharp right knee pain.  Pain does not radiate.  Pain worsens with ambulation, weightbearing, and palpation.  She denies numbness, tingling, or weakness.  She did not try anything prior to arrival and states that she does not tolerate ibuprofen secondary to gastric bypass surgery.  She is able to tolerate Tylenol if she takes nausea medicine with it.  The history is provided by the patient.    No past medical history on file.  There are no active problems to display for this patient.   Past Surgical History:  Procedure Laterality Date  . CESAREAN SECTION    . LAPAROSCOPIC GASTRIC SLEEVE RESECTION    . TUBAL LIGATION      OB History    No data available       Home Medications    Prior to Admission medications   Medication Sig Start Date End Date Taking? Authorizing Provider  FIBER, CORN DEXTRIN, PO Take by mouth.    [provider]  Ondansetron HCl (ZOFRAN PO) Take by mouth.    [provider]    Family History No family history on file.  Social History Social History   Tobacco Use  . Smoking status: Never Smoker  . Smokeless tobacco: Never Used  Substance  Use Topics  . Alcohol use: No  . Drug use: No     Allergies   Codeine; Latex; and Nsaids   Review of Systems Review of Systems  Musculoskeletal: Positive for arthralgias. Negative for back pain and neck pain.  Neurological: Negative for syncope, weakness, numbness and headaches.     Physical Exam Updated Vital Signs BP 121/83   Pulse 67   Temp 98.2 F (36.8 C)   Resp 16   Ht 5\' 6"  (1.676 m)   Wt 96.2 kg (212 lb)   LMP 09/12/2017   SpO2 100%   BMI 34.22 kg/m   Physical Exam  Constitutional: She appears well-developed and well-nourished. No distress.  HENT:  Head: Normocephalic and atraumatic.  Eyes: Conjunctivae are normal. Right eye exhibits no discharge. Left eye exhibits no discharge.  Neck: No JVD present. No tracheal deviation present.  Cardiovascular: Normal rate and intact distal pulses.  2+ DP/PT pulses bilaterally, no lower extremity edema, no calf tenderness  Pulmonary/Chest: Effort normal.  Abdominal: She exhibits no distension.  Musculoskeletal: She exhibits tenderness. She exhibits no edema.  Diffuse tenderness to palpation of the left knee anteriorly, primarily overlying the patella and medial and lateral joint lines.  Moderately decreased range of motion secondary to pain with flexion.  Right knee with tenderness to palpation overlying the lateral joint line.  Mildly decreased range  of motion secondary to pain on flexion.  No varus or valgus instability bilaterally.  Negative anterior/posterior drawer test bilaterally.  No swelling, erythema, deformity, or crepitus noted.  5/5 strength of BLE major muscle groups. No midline spine TTP, no paraspinal muscle tenderness, no deformity, crepitus, or step-off noted.  She has mild tenderness to palpation of the buttocks bilaterally  Neurological: She is alert. No sensory deficit.  Fluent speech, no facial droop, sensation intact to soft touch of bilateral lower extremities.  Antalgic gait, but able to walk on heels  and toes.   Skin: Skin is warm and dry. No erythema.  Psychiatric: She has a normal mood and affect. Her behavior is normal.  Nursing note and vitals reviewed.    ED Treatments / Results  Labs (all labs ordered are listed, but only abnormal results are displayed) Labs Reviewed - No data to display  EKG  EKG Interpretation None       Radiology Dg Knee Complete 4 Views Left  Result Date: 09/24/2017 CLINICAL DATA:  Knee pain secondary to a fall this morning. EXAM: LEFT KNEE - COMPLETE 4+ VIEW COMPARISON:  None. FINDINGS: No evidence of fracture, dislocation, or joint effusion. Tricompartmental osteoarthritis with medial joint space narrowing and marginal osteophyte formation in all 3 compartments. IMPRESSION: No acute abnormality.  Tricompartmental osteoarthritis. Electronically Signed   By: Francene BoyersJames  Maxwell M.D.   On: 09/24/2017 11:42   Dg Knee Complete 4 Views Right  Result Date: 09/24/2017 CLINICAL DATA:  Knee pain secondary to a fall this morning. EXAM: RIGHT KNEE - COMPLETE 4+ VIEW COMPARISON:  None. FINDINGS: No evidence of fracture, dislocation, or joint effusion. There is moderate tricompartmental osteoarthritis with marginal spur formation and medial joint space narrowing. IMPRESSION: No acute abnormality.  Tricompartmental moderate osteoarthritis. Electronically Signed   By: Francene BoyersJames  Maxwell M.D.   On: 09/24/2017 11:43    Procedures Procedures (including critical care time)  Medications Ordered in ED Medications - No data to display   Initial Impression / Assessment and Plan / ED Course  I have reviewed the triage vital signs and the nursing notes.  Pertinent labs & imaging results that were available during my care of the patient were reviewed by me and considered in my medical decision making (see chart for details).     Patient presents with bilateral knee pain right worse than left secondary to fall earlier today.  No head injury or loss of consciousness.  She is  afebrile and vital signs are stable.  She is neurovascularly intact.  She is ambulatory although it is painful.  Radiographs reviewed by me show tricompartmental arthritis but no acute bony abnormality such as fracture or dislocation.  No low back pain.  No red flag signs concerning for cauda equina or spinal abscess. RICE therapy indicated and discussed with patient.  Will discharge with crutches and knee sleeve for the right knee.  Recommend follow-up with primary care physician or orthopedist for reevaluation of symptoms in the next several days.  Discussed indications for return to the ED. Pt verbalized understanding of and agreement with plan and is safe for discharge home at this time.  She has no complaints prior to discharge.  Final Clinical Impressions(s) / ED Diagnoses   Final diagnoses:  Acute pain of both knees  Fall, initial encounter    ED Discharge Orders    None       Bennye AlmFawze, Jacole Capley A, PA-C 09/24/17 1157    Pricilla LovelessGoldston, Scott, MD 09/24/17 1246

## 2017-10-07 ENCOUNTER — Ambulatory Visit: Payer: 59 | Admitting: Family Medicine

## 2017-10-07 ENCOUNTER — Encounter: Payer: Self-pay | Admitting: Family Medicine

## 2017-10-07 DIAGNOSIS — M25561 Pain in right knee: Secondary | ICD-10-CM | POA: Diagnosis not present

## 2017-10-07 DIAGNOSIS — M25562 Pain in left knee: Secondary | ICD-10-CM | POA: Diagnosis not present

## 2017-10-07 NOTE — Patient Instructions (Signed)
You have severe knee contusions and a right patellar tendon strain. Ice areas 15 minutes at a time 3-4 times a day as needed. Tylenol 500mg  1-2 tabs three times a day as needed for pain. Topical capsaicin, salon pas, or biofreeze up to 4 times a day as needed for pain. Straight leg raises, knee extensions 3 sets of 10 once a day. As you improve start with half squats and half lunges but start only with 1 set of 5 and work your way up from there. Patellar tendon strap when up and walking around. Consider physical therapy. Follow up with me in 2 weeks but ok to come sooner if you improve faster than expected.

## 2017-10-08 ENCOUNTER — Encounter: Payer: Self-pay | Admitting: Family Medicine

## 2017-10-08 DIAGNOSIS — M25562 Pain in left knee: Secondary | ICD-10-CM

## 2017-10-08 DIAGNOSIS — M25561 Pain in right knee: Secondary | ICD-10-CM | POA: Insufficient documentation

## 2017-10-08 NOTE — Assessment & Plan Note (Signed)
independently reviewed radiographs and noted advanced degenerative changes for age but no fractures.  Exam is reassuring - brief MSK u/s of right proximal tibia showed no bony abnormalities in area of her pain, patellar tendon is intact, small hematoma and soft tissue swelling here.  Consistent with severe contusions and right patellar tendon strain.  Patellar tendon strap.  Tylenol, topical medications, icing.  Shown home exercises to do daily.  Consider PT.  F/u in 2 weeks.

## 2017-10-08 NOTE — Progress Notes (Signed)
PCP: [facility]  Subjective:   HPI: Patient is a 42 y.o. female here for bilateral knee pain.  Patient reports on 1/30 she was walking into a store, slipped on ice and fell directly onto both of her knees. Pain, swelling more on the right knee than the left - 7/10 and sharp vs 4/10. Difficulty with walking, squatting, kneeling. She cannot take nsaids due to her gastric surgery. Wearing a knee sleeve on right which helps but it folds down. No prior knee issues. No skin changes besides some bruising on right.  No numbness.  History reviewed. No pertinent past medical history.  No current outpatient medications on file prior to visit.   No current facility-administered medications on file prior to visit.     Past Surgical History:  Procedure Laterality Date  . CESAREAN SECTION    . LAPAROSCOPIC GASTRIC SLEEVE RESECTION    . TUBAL LIGATION      Allergies  Allergen Reactions  . Codeine     hives  . Latex     rash  . Nsaids Other (See Comments)    Gastric sleeve    Social History   Socioeconomic History  . Marital status: Single    Spouse name: Not on file  . Number of children: Not on file  . Years of education: Not on file  . Highest education level: Not on file  Social Needs  . Financial resource strain: Not on file  . Food insecurity - worry: Not on file  . Food insecurity - inability: Not on file  . Transportation needs - medical: Not on file  . Transportation needs - non-medical: Not on file  Occupational History  . Not on file  Tobacco Use  . Smoking status: Never Smoker  . Smokeless tobacco: Never Used  Substance and Sexual Activity  . Alcohol use: No  . Drug use: No  . Sexual activity: Yes    Birth control/protection: Surgical  Other Topics Concern  . Not on file  Social History Narrative  . Not on file    History reviewed. No pertinent family history.  BP 112/76   Pulse 69   Ht 5\' 6"  (1.676 m)   Wt 210 lb (95.3 kg)   LMP  09/12/2017   BMI 33.89 kg/m   Review of Systems: See HPI above.     Objective:  Physical Exam:  Gen: NAD, comfortable in exam room  Right knee: No gross deformity, ecchymoses.  Focal swelling over tibial tubercle.  No other deformity. TTP over tibial tubercle, distal patellar tendon.  Less medial and lateral joint lines. FROM with 5/5 strength - mild pain on extension. Negative ant/post drawers. Negative valgus/varus testing. Negative lachmanns. Negative mcmurrays, apleys, patellar apprehension. NV intact distally.  Left knee: No gross deformity, ecchymoses, swelling. Mild TTP over patella, anterior aspect medial joint line.  No other tenderness. FROM with 5/5 strength, no pain. Negative ant/post drawers. Negative valgus/varus testing. Negative lachmanns. Negative mcmurrays, apleys, patellar apprehension. NV intact distally.   Assessment & Plan:  1. Bilateral knee pain - independently reviewed radiographs and noted advanced degenerative changes for age but no fractures.  Exam is reassuring - brief MSK u/s of right proximal tibia showed no bony abnormalities in area of her pain, patellar tendon is intact, small hematoma and soft tissue swelling here.  Consistent with severe contusions and right patellar tendon strain.  Patellar tendon strap.  Tylenol, topical medications, icing.  Shown home exercises to do daily.  Consider PT.  F/u in 2 weeks.

## 2017-10-21 ENCOUNTER — Ambulatory Visit (INDEPENDENT_AMBULATORY_CARE_PROVIDER_SITE_OTHER): Payer: 59 | Admitting: Family Medicine

## 2017-10-21 ENCOUNTER — Encounter: Payer: Self-pay | Admitting: Family Medicine

## 2017-10-21 DIAGNOSIS — M25562 Pain in left knee: Secondary | ICD-10-CM | POA: Diagnosis not present

## 2017-10-21 DIAGNOSIS — M25561 Pain in right knee: Secondary | ICD-10-CM

## 2017-10-21 NOTE — Progress Notes (Signed)
PCP: [facility]  Subjective:   HPI: Patient is a 42 y.o. female here for bilateral knee pain.  2/12: Patient reports on 1/30 she was walking into a store, slipped on ice and fell directly onto both of her knees. Pain, swelling more on the right knee than the left - 7/10 and sharp vs 4/10. Difficulty with walking, squatting, kneeling. She cannot take nsaids due to her gastric surgery. Wearing a knee sleeve on right which helps but it folds down. No prior knee issues. No skin changes besides some bruising on right.  No numbness.  2/26: Patient reports her knees are much better. She has been doing home exercises. Takes tylenol occasionally. She's back to working out at [employer]. She can bike and do other things there without pain. Has some itchiness but not bothering her enough to take any medicines - has tried topical alcohol. Pain level 0/10 both knees.  History reviewed. No pertinent past medical history.  No current outpatient medications on file prior to visit.   No current facility-administered medications on file prior to visit.     Past Surgical History:  Procedure Laterality Date  . CESAREAN SECTION    . LAPAROSCOPIC GASTRIC SLEEVE RESECTION    . TUBAL LIGATION      Allergies  Allergen Reactions  . Codeine     hives  . Latex     rash  . Nsaids Other (See Comments)    Gastric sleeve    Social History   Socioeconomic History  . Marital status: Single    Spouse name: Not on file  . Number of children: Not on file  . Years of education: Not on file  . Highest education level: Not on file  Social Needs  . Financial resource strain: Not on file  . Food insecurity - worry: Not on file  . Food insecurity - inability: Not on file  . Transportation needs - medical: Not on file  . Transportation needs - non-medical: Not on file  Occupational History  . Not on file  Tobacco Use  . Smoking status: Never Smoker  . Smokeless tobacco: Never Used   Substance and Sexual Activity  . Alcohol use: No  . Drug use: No  . Sexual activity: Yes    Birth control/protection: Surgical  Other Topics Concern  . Not on file  Social History Narrative  . Not on file    History reviewed. No pertinent family history.  BP 110/75   Pulse 79   Ht 5\' 6"  (1.676 m)   Wt 215 lb (97.5 kg)   BMI 34.70 kg/m   Review of Systems: See HPI above.     Objective:  Physical Exam:  Gen: NAD, comfortable in exam room.  Right knee: No gross deformity, ecchymoses, swelling. No TTP. FROM with 5/5 strength. Negative ant/post drawers. Negative valgus/varus testing. Negative lachmanns. Negative mcmurrays, apleys, patellar apprehension. NV intact distally.  Left knee: No gross deformity, ecchymoses, swelling. No TTP. FROM with 5/5 strength. Negative ant/post drawers. Negative valgus/varus testing. Negative lachmanns. Negative mcmurrays, apleys, patellar apprehension. NV intact distally.   Assessment & Plan:  1. Bilateral knee pain - Radiographs negative for acute findings (has age-advanced DJD).  2/2 contusion and patellar tendon strain from her fall.  Much improved.  Encouraged to continue working out at gym.  Tylenol, icing only if needed.  Claritin for itching with benadryl as needed.  F/u prn.

## 2017-10-21 NOTE — Patient Instructions (Signed)
You're doing great! Tylenol, icing only if needed at this point. Consider claritin if the itching is bothering you enough. Benadryl either at bedtime (because it can make you sleepy) for the itching or if it's severe. Follow up with me as needed otherwise.

## 2017-10-21 NOTE — Assessment & Plan Note (Signed)
Radiographs negative for acute findings (has age-advanced DJD).  2/2 contusion and patellar tendon strain from her fall.  Much improved.  Encouraged to continue working out at gym.  Tylenol, icing only if needed.  Claritin for itching with benadryl as needed.  F/u prn.

## 2017-11-11 ENCOUNTER — Telehealth: Payer: Self-pay | Admitting: Family Medicine

## 2017-11-11 NOTE — Telephone Encounter (Signed)
Patient requesting a more detailed letter stating that she was required to be out of work from 2/12 - 2/26 due to her condition. 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

## 2017-11-11 NOTE — Telephone Encounter (Signed)
Her note dated 2/12 reflected this.  If they didn't have light duty for her it stated she must remain out of work. Through her follow up with me on 2/26.  If she needs another copy of the note it's ok to print and I'll sign it.

## 2017-11-12 NOTE — Telephone Encounter (Signed)
Patient was informed. Stated she did not need another copy

## 2017-11-27 DIAGNOSIS — L710 Perioral dermatitis: Secondary | ICD-10-CM | POA: Diagnosis not present

## 2017-11-27 DIAGNOSIS — L0100 Impetigo, unspecified: Secondary | ICD-10-CM | POA: Diagnosis not present

## 2018-03-12 DIAGNOSIS — Z131 Encounter for screening for diabetes mellitus: Secondary | ICD-10-CM | POA: Diagnosis not present

## 2018-03-16 DIAGNOSIS — I10 Essential (primary) hypertension: Secondary | ICD-10-CM | POA: Diagnosis not present

## 2018-03-26 DIAGNOSIS — Z131 Encounter for screening for diabetes mellitus: Secondary | ICD-10-CM | POA: Diagnosis not present

## 2018-03-26 DIAGNOSIS — I10 Essential (primary) hypertension: Secondary | ICD-10-CM | POA: Diagnosis not present

## 2018-03-26 DIAGNOSIS — Z5181 Encounter for therapeutic drug level monitoring: Secondary | ICD-10-CM | POA: Diagnosis not present

## 2018-03-26 DIAGNOSIS — Z136 Encounter for screening for cardiovascular disorders: Secondary | ICD-10-CM | POA: Diagnosis not present

## 2018-03-26 DIAGNOSIS — Z0000 Encounter for general adult medical examination without abnormal findings: Secondary | ICD-10-CM | POA: Diagnosis not present

## 2018-04-16 DIAGNOSIS — D508 Other iron deficiency anemias: Secondary | ICD-10-CM | POA: Diagnosis not present

## 2018-04-16 DIAGNOSIS — I10 Essential (primary) hypertension: Secondary | ICD-10-CM | POA: Diagnosis not present

## 2018-05-12 DIAGNOSIS — I10 Essential (primary) hypertension: Secondary | ICD-10-CM | POA: Diagnosis not present

## 2018-05-12 DIAGNOSIS — E669 Obesity, unspecified: Secondary | ICD-10-CM | POA: Diagnosis not present

## 2018-05-27 ENCOUNTER — Telehealth: Payer: Self-pay | Admitting: Mental Health

## 2018-05-27 NOTE — Telephone Encounter (Signed)
Counselor already aware and has chart.

## 2018-05-27 NOTE — Telephone Encounter (Signed)
Pt called to check status of forms. Return call [phone]

## 2018-06-01 ENCOUNTER — Telehealth: Payer: Self-pay | Admitting: Mental Health

## 2018-06-11 DIAGNOSIS — I10 Essential (primary) hypertension: Secondary | ICD-10-CM | POA: Diagnosis not present

## 2018-06-11 DIAGNOSIS — E669 Obesity, unspecified: Secondary | ICD-10-CM | POA: Diagnosis not present

## 2018-06-12 ENCOUNTER — Ambulatory Visit (INDEPENDENT_AMBULATORY_CARE_PROVIDER_SITE_OTHER): Payer: 59 | Admitting: Mental Health

## 2018-06-12 DIAGNOSIS — F411 Generalized anxiety disorder: Secondary | ICD-10-CM | POA: Diagnosis not present

## 2018-06-12 DIAGNOSIS — F331 Major depressive disorder, recurrent, moderate: Secondary | ICD-10-CM | POA: Diagnosis not present

## 2018-06-12 NOTE — Progress Notes (Signed)
      [facility] Counselor/Therapist Progress Note   Patient ID: Kristina Gonzalez, MRN: [id number]  Date: 06/12/2018  Timespent: 45 minutes  Treatment Type: Individual  Subjective:  Paitient exhausted, edgy. Very stressed. Still unable to get restful sleep. Financial stressors. Problems with mentally ill son's girlfriend. Pressured speech/   Interventions:CBT, Conservator, museum/gallery and Supportive  Mental Status Exam:   Appearance:   Casual     Behavior:  stressed and uptight  Motor:  Restlestness  Speech/Language:   Pressured  Affect:  Depressed and Labile  Mood:  anxious, depressed, irritable and sad  Thought process:  Intact  Thought content:    WDL and Logical  Perceptual disturbances:    Normal  Orientation:  Full (Time, Place, and Person)  Attention:  Fair  Concentration:  down slightly  Memory:  Immediate  Fund of knowledge:   Good  Insight:    Good  Judgment:   Intact  Impulse Control:  good    Reported Symptoms: Stress, exhaustion, overwhelmed. Insomnia. Downhearted, edgy, nervous.  Risk Assessment: Danger to Self:  No Self-injurious Behavior: No Danger to Others: No Duty to Warn:no Physical Aggression / Violence:No  Access to Firearms a concern: No  Gang Involvement:No   Diagnosis:   ICD-10-CM   1. Major depressive disorder, recurrent episode, moderate (HCC) F33.1      Plan:    Medication compliance -  medications prescribed by PA               Boundaries and setting limits              Assertiveness              Stress management              Return to office in two  weeks Ulice Bold, [facility]

## 2018-06-25 DIAGNOSIS — E669 Obesity, unspecified: Secondary | ICD-10-CM | POA: Diagnosis not present

## 2018-06-25 DIAGNOSIS — I10 Essential (primary) hypertension: Secondary | ICD-10-CM | POA: Diagnosis not present

## 2018-07-01 ENCOUNTER — Ambulatory Visit: Payer: 59 | Admitting: Mental Health

## 2018-07-09 ENCOUNTER — Ambulatory Visit (HOSPITAL_COMMUNITY): Payer: 59 | Admitting: Psychiatry

## 2018-07-09 DIAGNOSIS — E669 Obesity, unspecified: Secondary | ICD-10-CM | POA: Diagnosis not present

## 2018-07-09 DIAGNOSIS — I10 Essential (primary) hypertension: Secondary | ICD-10-CM | POA: Diagnosis not present

## 2018-07-14 ENCOUNTER — Encounter: Payer: Self-pay | Admitting: Emergency Medicine

## 2018-07-14 DIAGNOSIS — F331 Major depressive disorder, recurrent, moderate: Secondary | ICD-10-CM | POA: Insufficient documentation

## 2018-07-14 DIAGNOSIS — F411 Generalized anxiety disorder: Secondary | ICD-10-CM | POA: Insufficient documentation

## 2018-07-15 ENCOUNTER — Ambulatory Visit (INDEPENDENT_AMBULATORY_CARE_PROVIDER_SITE_OTHER): Payer: 59 | Admitting: Mental Health

## 2018-07-15 DIAGNOSIS — F411 Generalized anxiety disorder: Secondary | ICD-10-CM

## 2018-07-15 DIAGNOSIS — F331 Major depressive disorder, recurrent, moderate: Secondary | ICD-10-CM

## 2018-07-15 NOTE — Progress Notes (Signed)
      [facility] Counselor/Therapist Progress Note   Patient ID: Kristina Gonzalez, MRN: [id number]  Date: 07/15/2018  Timespent: 45 minutes   Treatment Type: Individual   Reported Symptoms: Sleep deprivation, depresssion, sense of always being overwhelmed, financially strain.   Mental Status Exam:    Appearance:   Casual     Behavior:  Appropriate  Motor:  Normal  Speech/Language:   Normal Rate  Affect:  Depressed and anxious  Mood:  anxious and depressed  Thought process:  normal  Thought content:    WNL  Sensory/Perceptual disturbances:    WNL  Orientation:  oriented to person, place and time/date  Attention:  Good  Concentration:  Fair  Memory:  fair  Fund of knowledge:   Good  Insight:    Good  Judgment:   Fair  Impulse Control:  Good     Risk Assessment: Danger to Self:  No Self-injurious Behavior: No Danger to Others: No Duty to Warn:no Physical Aggression / Violence:No  Access to Firearms a concern: No  Gang Involvement:No    Subjective: Overwhelmed, stressed, financially struggling. Emotional dysregulation.  Ends up yelling at family. Frustrated.   Interventions: Cognitive Behavioral Therapy, Solution-Oriented/Positive Psychology, Family Systems and Interpersonal   Diagnosis:   ICD-10-CM   1. Major depressive disorder, recurrent episode, moderate (HCC) F33.1   2. Generalized anxiety disorder F41.1      Plan:  Self-care program Restore restful sleep Stabilize mood, decrease depression Seek positive opportunities to get validation and support, support network, set up positive reinforcements   Kristina Gonzalez, [facility]

## 2018-07-16 ENCOUNTER — Encounter (HOSPITAL_BASED_OUTPATIENT_CLINIC_OR_DEPARTMENT_OTHER): Payer: Self-pay | Admitting: *Deleted

## 2018-07-16 ENCOUNTER — Other Ambulatory Visit: Payer: Self-pay

## 2018-07-16 ENCOUNTER — Emergency Department (HOSPITAL_BASED_OUTPATIENT_CLINIC_OR_DEPARTMENT_OTHER)
Admission: EM | Admit: 2018-07-16 | Discharge: 2018-07-16 | Disposition: A | Discharge status: Home / Self Care | Payer: 59 | Attending: Emergency Medicine | Admitting: Emergency Medicine

## 2018-07-16 DIAGNOSIS — Y939 Activity, unspecified: Secondary | ICD-10-CM | POA: Diagnosis not present

## 2018-07-16 DIAGNOSIS — Y999 Unspecified external cause status: Secondary | ICD-10-CM | POA: Diagnosis not present

## 2018-07-16 DIAGNOSIS — Y92410 Unspecified street and highway as the place of occurrence of the external cause: Secondary | ICD-10-CM | POA: Diagnosis not present

## 2018-07-16 DIAGNOSIS — S4991XA Unspecified injury of right shoulder and upper arm, initial encounter: Secondary | ICD-10-CM | POA: Diagnosis not present

## 2018-07-16 DIAGNOSIS — T148XXA Other injury of unspecified body region, initial encounter: Secondary | ICD-10-CM

## 2018-07-16 DIAGNOSIS — Z79899 Other long term (current) drug therapy: Secondary | ICD-10-CM | POA: Diagnosis not present

## 2018-07-16 DIAGNOSIS — S46911A Strain of unspecified muscle, fascia and tendon at shoulder and upper arm level, right arm, initial encounter: Secondary | ICD-10-CM | POA: Insufficient documentation

## 2018-07-16 MED ORDER — CYCLOBENZAPRINE HCL 10 MG PO TABS: 10.0000 mg | ORAL_TABLET | Freq: Two times a day (BID) | ORAL | 0 refills | Status: AC | PRN

## 2018-07-16 NOTE — Discharge Instructions (Signed)
Please read instructions below. Apply ice to your areas of pain for 20 minutes at a time. You can take tylenol every 4 hours as needed for pain. You can take flexeril every 12 hours as needed for muscle spasm. Be aware this medication can make you drowsy. Schedule an appointment with your primary care provider to follow up on your visit today. Return to the ER for severely worsening headache, vision changes, if new numbness or tingling in your arms or legs, inability to urinate, inability to hold your bowels, or weakness in your extremities.
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
## 2018-08-03 ENCOUNTER — Ambulatory Visit: Payer: 59 | Admitting: Mental Health

## 2018-08-06 ENCOUNTER — Encounter: Payer: Self-pay | Admitting: Mental Health

## 2018-08-06 ENCOUNTER — Ambulatory Visit (INDEPENDENT_AMBULATORY_CARE_PROVIDER_SITE_OTHER): Payer: 59 | Admitting: Mental Health

## 2018-08-06 DIAGNOSIS — F331 Major depressive disorder, recurrent, moderate: Secondary | ICD-10-CM

## 2018-08-06 DIAGNOSIS — F411 Generalized anxiety disorder: Secondary | ICD-10-CM
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
## 2018-08-11 DIAGNOSIS — E669 Obesity, unspecified: Secondary | ICD-10-CM | POA: Diagnosis not present

## 2018-08-11 DIAGNOSIS — I10 Essential (primary) hypertension: Secondary | ICD-10-CM | POA: Diagnosis not present

## 2018-08-28 ENCOUNTER — Ambulatory Visit: Payer: 59 | Admitting: Mental Health

## 2018-09-09 ENCOUNTER — Encounter: Payer: Self-pay | Admitting: Mental Health

## 2018-09-09 ENCOUNTER — Ambulatory Visit (INDEPENDENT_AMBULATORY_CARE_PROVIDER_SITE_OTHER): Payer: 59 | Admitting: Mental Health

## 2018-09-09 DIAGNOSIS — F411 Generalized anxiety disorder: Secondary | ICD-10-CM | POA: Diagnosis not present

## 2018-09-09 DIAGNOSIS — F331 Major depressive disorder, recurrent, moderate: Secondary | ICD-10-CM | POA: Diagnosis not present
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
## 2018-09-22 DIAGNOSIS — E669 Obesity, unspecified: Secondary | ICD-10-CM | POA: Diagnosis not present

## 2018-09-22 DIAGNOSIS — I10 Essential (primary) hypertension: Secondary | ICD-10-CM | POA: Diagnosis not present

## 2018-10-02 ENCOUNTER — Encounter: Payer: Self-pay | Admitting: Mental Health

## 2018-10-02 ENCOUNTER — Ambulatory Visit: Payer: 59 | Admitting: Mental Health

## 2018-10-02 DIAGNOSIS — F411 Generalized anxiety disorder: Secondary | ICD-10-CM

## 2018-10-02 DIAGNOSIS — F331 Major depressive disorder, recurrent, moderate: Secondary | ICD-10-CM

## 2018-10-02 NOTE — Progress Notes (Deleted)
      [facility] Counselor/Therapist Progress Note  Patient ID: Kristina Gonzalez, MRN: [id number],    Date: 10/02/2018  Time Spent: 45 minutes  Treatment Type: {CH[facility] AMB THERAPY [phone]}  Reported Symptoms: {CHL AMB [employer] COUNSELOR SYMP LIST:22089}  Mental Status Exam:  Appearance:   {PSY:22683}     Behavior:  {PSY:21022743}  Motor:  {PSY:22302}  Speech/Language:   {PSY:22685}  Affect:  {PSY:22687}  Mood:  {PSY:31886}  Thought process:  {PSY:31888}  Thought content:    {PSY:810-095-4497}  Sensory/Perceptual disturbances:    {PSY:681-705-2150}  Orientation:  {PSY:30297}  Attention:  {PSY:22877}  Concentration:  {PSY:952-275-3276}  Memory:  {PSY:202 504 4931}  Fund of knowledge:   {PSY:952-275-3276}  Insight:    {PSY:952-275-3276}  Judgment:   {PSY:952-275-3276}  Impulse Control:  {PSY:952-275-3276}   Risk Assessment: Danger to Self:  {PSY:22692} Self-injurious Behavior: {PSY:22692} Danger to Others: {PSY:22692} Duty to Warn:{PSY:311194} Physical Aggression / Violence:{PSY:21197} Access to Firearms a concern: {PSY:21197} Gang Involvement:{PSY:21197}  Subjective: ***   Interventions: {PSY:332-089-3746}  Diagnosis:   ICD-10-CM   1. Major depressive disorder, recurrent episode, moderate (HCC) F33.1   2. Generalized anxiety disorder F41.1     Plan: ***  Ulice Bold, [facility]

## 2018-10-02 NOTE — Progress Notes (Signed)
      [facility] Counselor/Therapist Progress Note  Patient ID: Kristina Gonzalez, MRN: [id number],    Date: 10/02/2018  Time Spent: 45 minutes   Treatment Type: Individual Therapy  Reported Symptoms: Depressed mood, Anxious Mood, Sleep disturbance and Fatigue  Mental Status Exam:  Appearance:   Casual     Behavior:  Appropriate  Motor:  Normal  Speech/Language:   Normal Rate  Affect:  Flat and Full Range  Mood:  anxious  Thought process:  normal  Thought content:    WNL  Sensory/Perceptual disturbances:    WNL  Orientation:  oriented to person, place and time/date  Attention:  Good  Concentration:  Good  Memory:  WNL  Fund of knowledge:   Good  Insight:    Good  Judgment:   Good  Impulse Control:  Good   Risk Assessment: Danger to Self:  No Self-injurious Behavior: No Danger to Others: No Duty to Warn:no Physical Aggression / Violence:No  Access to Firearms a concern: No  Gang Involvement:No   Subjective:  Anxiety and stress still problematic. Unable to sleep at night. Worries constantly about her children. Daughter in college having hives and anxiety issues. Unable to do things at home that need to be done, such as get groceries. Financial stress.  Interventions: Cognitive Behavioral Therapy, Solution-Oriented/Positive Psychology, Insight-Oriented and Interpersonal  Diagnosis:   ICD-10-CM   1. Major depressive disorder, recurrent episode, moderate (HCC) F33.1   2. Generalized anxiety disorder F41.1     Plan:   Decrease level of anxiety             Restore restful sleep             Boundaries/ setting limits             Self care program              Improve ADLs              Validation and support    Ulice Bold, [facility]

## 2018-10-20 DIAGNOSIS — E669 Obesity, unspecified: Secondary | ICD-10-CM | POA: Diagnosis not present

## 2018-10-20 DIAGNOSIS — I10 Essential (primary) hypertension: Secondary | ICD-10-CM | POA: Diagnosis not present

## 2018-10-30 ENCOUNTER — Ambulatory Visit: Payer: 59 | Admitting: Mental Health

## 2018-11-04 ENCOUNTER — Ambulatory Visit: Payer: 59 | Admitting: Mental Health

## 2018-11-20 ENCOUNTER — Ambulatory Visit (INDEPENDENT_AMBULATORY_CARE_PROVIDER_SITE_OTHER): Payer: Self-pay | Admitting: Mental Health

## 2018-11-20 ENCOUNTER — Other Ambulatory Visit: Payer: Self-pay

## 2018-11-20 ENCOUNTER — Encounter: Payer: Self-pay | Admitting: Mental Health

## 2018-11-20 DIAGNOSIS — F331 Major depressive disorder, recurrent, moderate: Secondary | ICD-10-CM

## 2018-11-20 DIAGNOSIS — F411 Generalized anxiety disorder: Secondary | ICD-10-CM

## 2018-11-20 NOTE — Progress Notes (Signed)
[facility] Counselor/Therapist Progress Note  Patient ID: Kristina Gonzalez, MRN: [id number],    Date: 11/20/2018  Time Spent: 45 Minutes  I connected with patient by telephone, with their informed consent, and verified patient privacy and that I am speaking with the correct person using two identifiers.  I was located at home and patient at home.   Treatment Type: Individual Therapy  Reported Symptoms: Anxiety and stress.Denies depression but has bereavement issues due to death in family. Hopeful and positive about RTW.  Mental Status Exam:  Appearance:   unseen - telephone session     Behavior:  Appropriate and Sharing - from verbal output alone, unseen  Motor:  unseen - telephone session  Speech/Language:   Clear and Coherent  Affect:  positive  Mood:  euthymic  Thought process:  normal  Thought content:    WNL  Sensory/Perceptual disturbances:    WNL  Orientation:  oriented to person, place and time/date  Attention:  Good  Concentration:  Good  Memory:  WNL  Fund of knowledge:   Good  Insight:    Good  Judgment:   Good  Impulse Control:  Good   Risk Assessment: Danger to Self:  No Self-injurious Behavior: No Danger to Others: No Duty to Warn:no Physical Aggression / Violence:No  Access to Firearms a concern: No  Gang Involvement:No   Subjective: Patient on medical leave for past 10 months, plans to RTW in late April after the [facility] Virus ends. Sister's husband was killed a week ago in a motorcycle accident. Unable to have funeral. Brigit's mentally ill adult son is helping sister an another town with can lot business and dog raising enterprise and visiting family members. Brigit is having a break and getting extra rest. Energy good. Mood stable.  Interventions: Cognitive Behavioral Therapy, Solution-Oriented/Positive Psychology, Insight-Oriented, Interpersonal and supportive  Diagnosis: F33.1, F.41.1      Treatment Plan   Patient Name:  Kristina Gonzalez   Date: November 20, 2018   Didactic topic to be discussed:           Anxiety:                   Locus of control                              Work/Life balance           Depression                             Problem-solving                              Relationships                                   Boundaries                                     Coping srategies                             Communication  Recovery from trauma                    Self-care                                     Validation  Other     Goals:  Patient  1. Maintains mood stabiity:  decreased symptoms of     depression     anxiety  2.   Practices pro-active self-care:   restful sleep, nutrition, exercise, socialization  3.   Effective utilizes boundaries and sets limits  4.   Utliizes coping strategies and problem solving techniques for stress management  5.   Feels accurately heard, understood and validated  Other:  RTW in late April      Tenny Craw [facility]

## 2018-12-23 ENCOUNTER — Ambulatory Visit: Payer: Self-pay | Admitting: Mental Health

## 2018-12-25 ENCOUNTER — Other Ambulatory Visit: Payer: Self-pay

## 2018-12-25 ENCOUNTER — Ambulatory Visit (INDEPENDENT_AMBULATORY_CARE_PROVIDER_SITE_OTHER): Payer: 59 | Admitting: Mental Health

## 2018-12-25 ENCOUNTER — Encounter: Payer: Self-pay | Admitting: Mental Health

## 2018-12-25 DIAGNOSIS — F331 Major depressive disorder, recurrent, moderate: Secondary | ICD-10-CM

## 2018-12-25 DIAGNOSIS — F411 Generalized anxiety disorder: Secondary | ICD-10-CM | POA: Diagnosis not present

## 2018-12-25 NOTE — Progress Notes (Signed)
[facility] Counselor/Therapist Progress Note  Patient ID: Kristina Gonzalez, MRN: [id number],    Date: 12/25/2018  Time Spent: 45 minutes  I connected with patient by a video enabled telemedicine application or telephone, with their informed consent, and verified patient privacy and that I am speaking with the correct person using two identifiers.  I was located at home and patient at home. Corona Virus Pandemic. 9:00 PM  Treatment Type: Individual Therapy  Reported Symptoms: Anxiety, poor sleep, worry about finances. Able to enjoy.  Mental Status Exam:  Appearance:   unseen - telephone session     Behavior:  Appropriate  Motor:  Restlestness  Speech/Language:   Normal Rate  Affect:  Appropriate  Mood:  anxious  Thought process:  normal  Thought content:    WNL  Sensory/Perceptual disturbances:    WNL  Orientation:  oriented to person, place and time/date  Attention:  Good  Concentration:  Good  Memory:  WNL  Fund of knowledge:   Good  Insight:    Good  Judgment:   Good  Impulse Control:  Good   Risk Assessment: Danger to Self:  No Self-injurious Behavior: No Danger to Others: No Duty to Warn:no Physical Aggression / Violence:No  Access to Firearms a concern: No  Gang Involvement:No   Subjective:  Conversant. Polite and cooperative. Stressed about bills and mortgage since has been unable to work. Caron Presume has been able to video chat with Dr. Sandria Manly and continue to get his injections. Caron Presume has a new girlfriend who teaches yoga, grows her own vegetables and s vegan. Brigit anticipates going back to work May 11. Not sleeping well due to anxiety. ADLs going smoothly.  Interventions: Solution-Oriented/Positive Psychology, Insight-Oriented, Interpersonal and supportive  Diagnosis:   ICD-10-CM   1. Generalized anxiety disorder F41.1   2. Major depressive disorder, recurrent episode, moderate (HCC) F33.1       Treatment Plan   Patient Name:  Kristina Gonzalez   Date:  Dec 25, 2018   Didactic topic to be discussed:           Anxiety:                   Locus of control                              Work/Life balance           Depression                             Problem-solving                              Relationships                                   Boundaries                                     Coping srategies                             Communication  Recovery from trauma                    Self-care                                     Validation  Other     Goals:  Patient  1. Maintains mood stabiity:  decreased symptoms of     depression     anxiety  2.   Practices pro-active self-care:   restful sleep, nutrition, exercise, socialization  3.   Effective utilizes boundaries and sets limits  4.   Utliizes coping strategies and problem solving techniques for stress management  5.   Feels accurately heard, understood and validated  Other: Moves to a new position at work      [employer]oh[employer] HaasCarson M. Ranae PalmsSarvis [facility]    Ulice Boldarson Carlyann Placide, [facility]

## 2018-12-25 NOTE — Progress Notes (Deleted)
      [facility] Counselor/Therapist Progress Note  Patient ID: Kristina Gonzalez, MRN: [id number],    Date: 12/25/2018  Time Spent: ***   Treatment Type: {CHL AMB THERAPY [phone]}  Reported Symptoms: ***  Mental Status Exam:  Appearance:   {PSY:22683}     Behavior:  {PSY:21022743}  Motor:  {PSY:22302}  Speech/Language:   {PSY:22685}  Affect:  {PSY:22687}  Mood:  {PSY:31886}  Thought process:  {PSY:31888}  Thought content:    {[phone]}  Sensory/Perceptual disturbances:    {[phone]}  Orientation:  {PSY:30297}  Attention:  {PSY:22877}  Concentration:  {PSY:574 361 8060}  Memory:  {PSY:585-171-4860}  Fund of knowledge:   {PSY:574 361 8060}  Insight:    {PSY:574 361 8060}  Judgment:   {PSY:574 361 8060}  Impulse Control:  {PSY:574 361 8060}   Risk Assessment: Danger to Self:  {PSY:22692} Self-injurious Behavior: {PSY:22692} Danger to Others: {PSY:22692} Duty to Warn:{PSY:311194} Physical Aggression / Violence:{PSY:21197} Access to Firearms a concern: {PSY:21197} Gang Involvement:{PSY:21197}  Subjective: ***   Interventions: {[phone]}  Diagnosis:   ICD-10-CM   1. Generalized anxiety disorder F41.1   2. Major depressive disorder, recurrent episode, moderate (HCC) F33.1     Plan: ***  Ulice Bold, [facility]

## 2019-01-14 ENCOUNTER — Encounter: Payer: Self-pay | Admitting: Mental Health

## 2019-01-14 ENCOUNTER — Other Ambulatory Visit: Payer: Self-pay

## 2019-01-14 ENCOUNTER — Ambulatory Visit (INDEPENDENT_AMBULATORY_CARE_PROVIDER_SITE_OTHER): Payer: 59 | Admitting: Mental Health

## 2019-01-14 DIAGNOSIS — F331 Major depressive disorder, recurrent, moderate: Secondary | ICD-10-CM | POA: Diagnosis not present

## 2019-01-14 DIAGNOSIS — F411 Generalized anxiety disorder: Secondary | ICD-10-CM
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
## 2019-03-16 NOTE — Telephone Encounter (Signed)
Phone call documented.

## 2019-04-22 IMAGING — DX DG KNEE COMPLETE 4+V*R*
4 series · 4 of 4 positions shown · non-contrast
Comparison: None.

CLINICAL DATA: Knee pain secondary to a fall this morning.

EXAM:
RIGHT KNEE - COMPLETE 4+ VIEW

[knee ap]
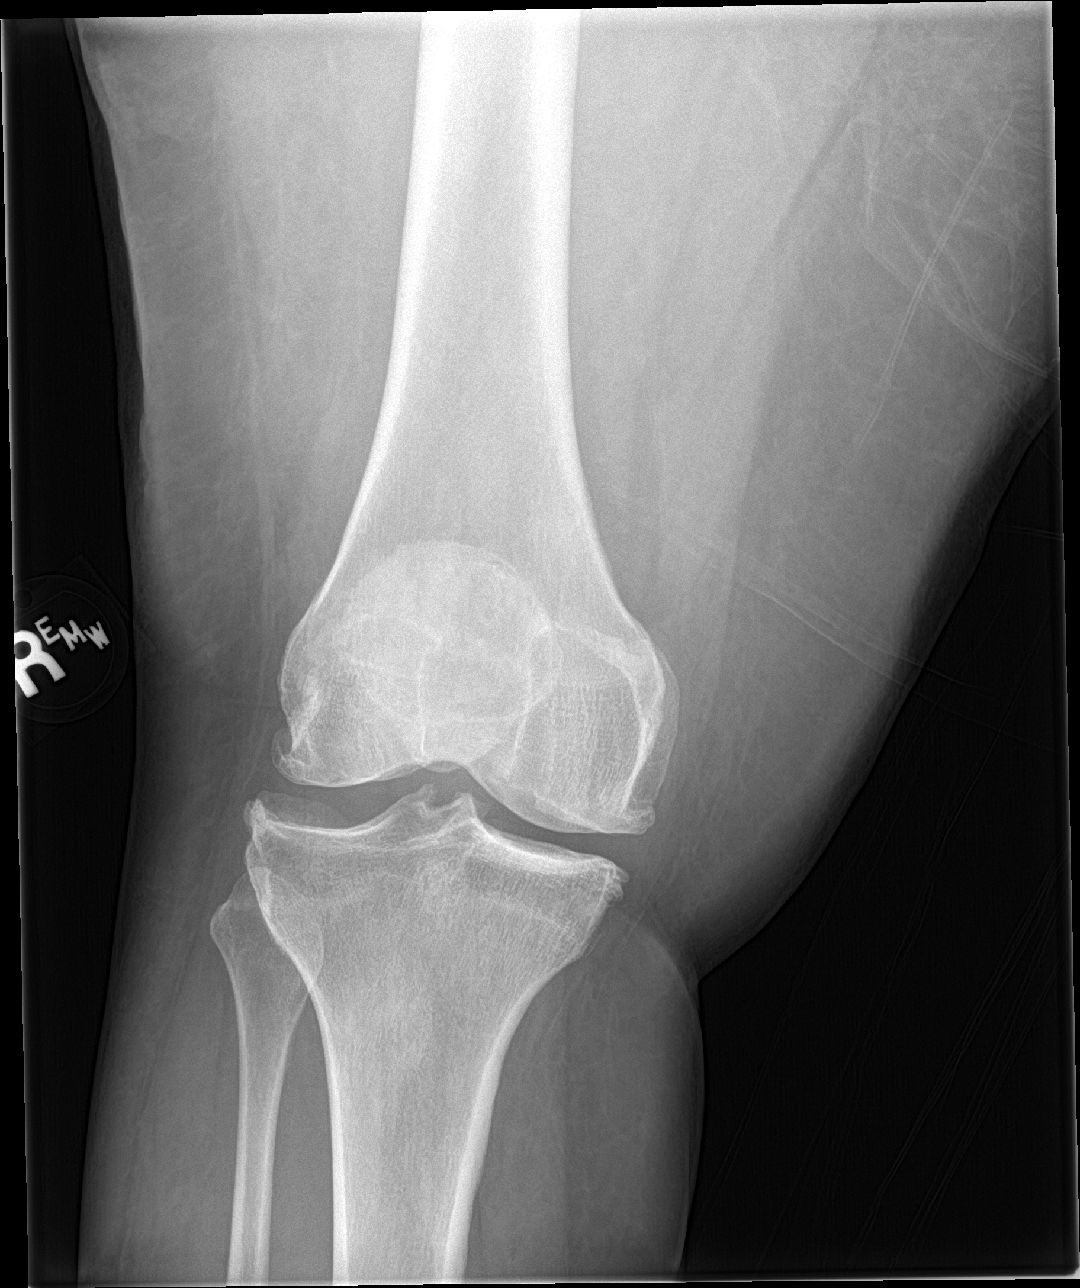

[knee lat]
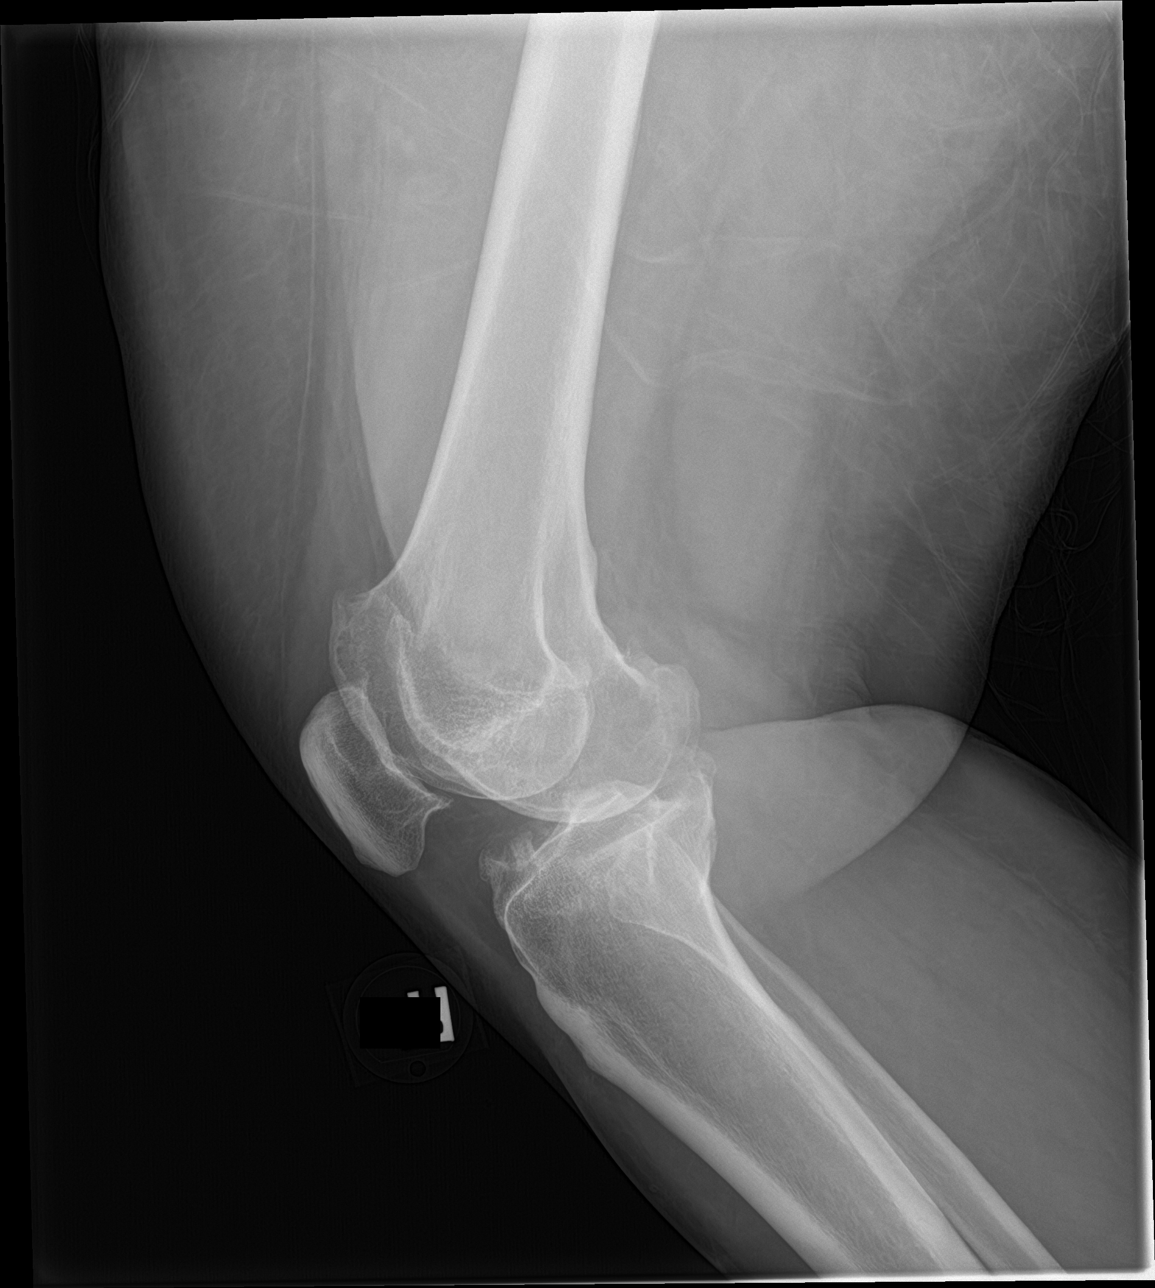

[knee obl (1 of 2)]
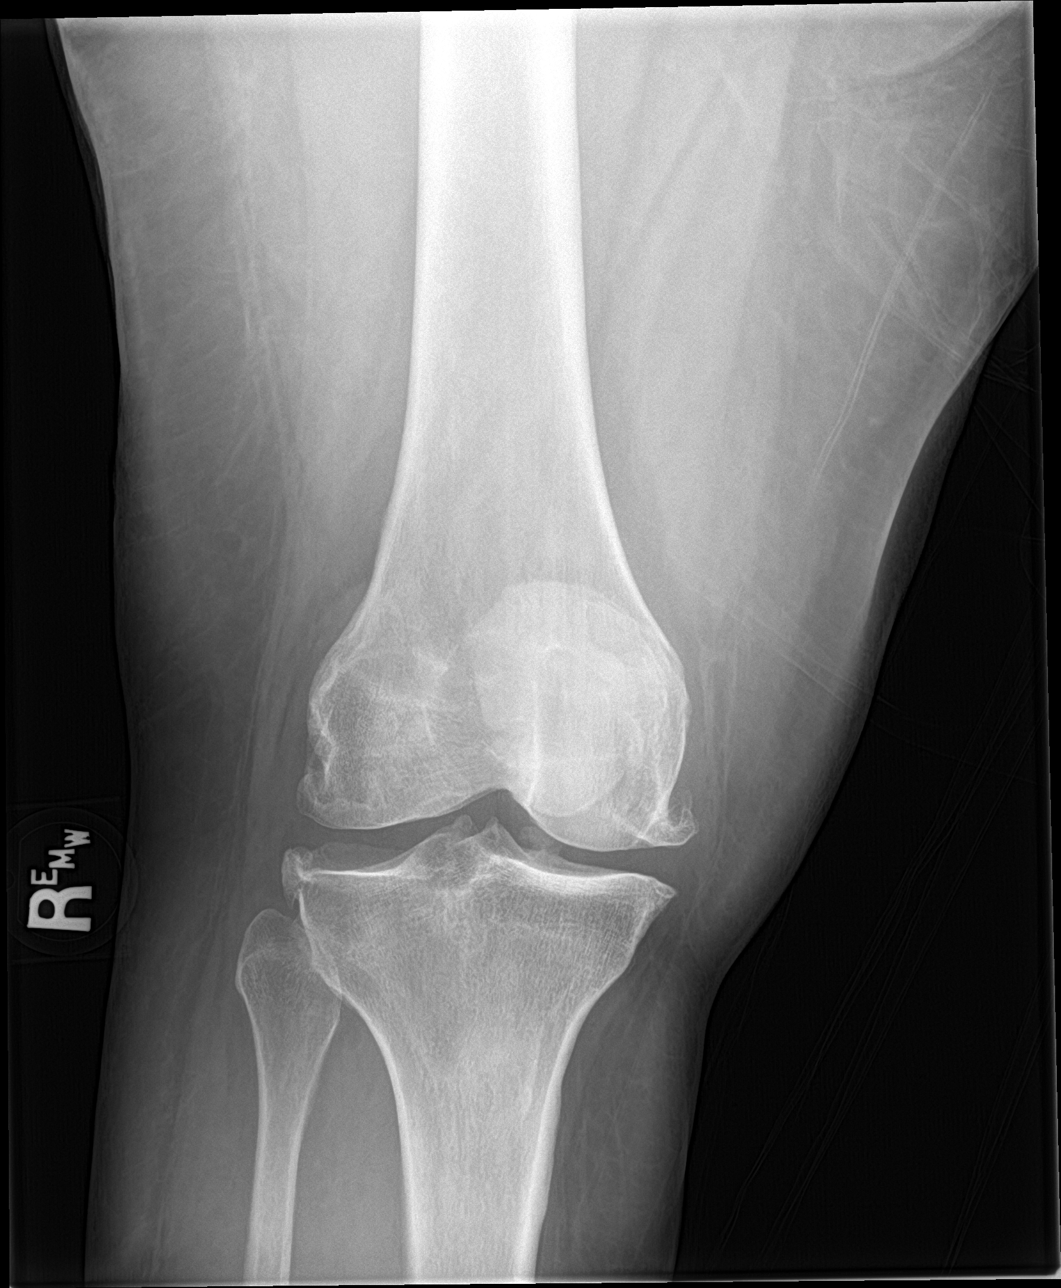

[knee obl (2 of 2)]
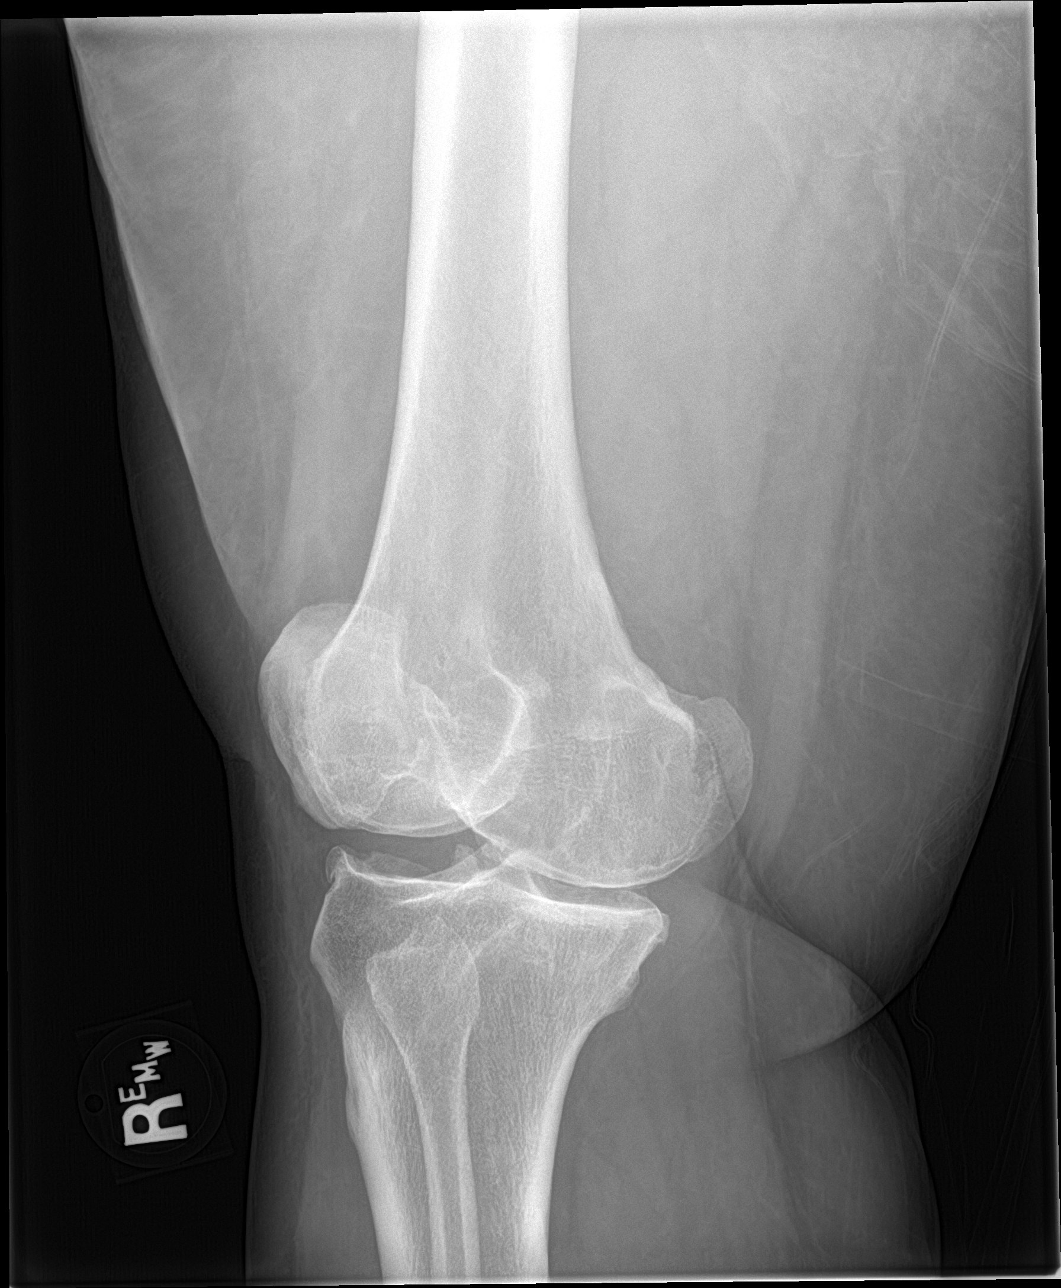

[4 of 4 positions shown; findings below may reference images not displayed]

FINDINGS: No evidence of fracture, dislocation, or joint effusion. There is
moderate tricompartmental osteoarthritis with marginal spur
formation and medial joint space narrowing.
IMPRESSION: No acute abnormality.  Tricompartmental moderate osteoarthritis.

## 2019-04-22 IMAGING — DX DG KNEE COMPLETE 4+V*L*
4 series · 4 of 4 positions shown · non-contrast
Comparison: None.

CLINICAL DATA: Knee pain secondary to a fall this morning.

EXAM:
LEFT KNEE - COMPLETE 4+ VIEW

[knee ap]
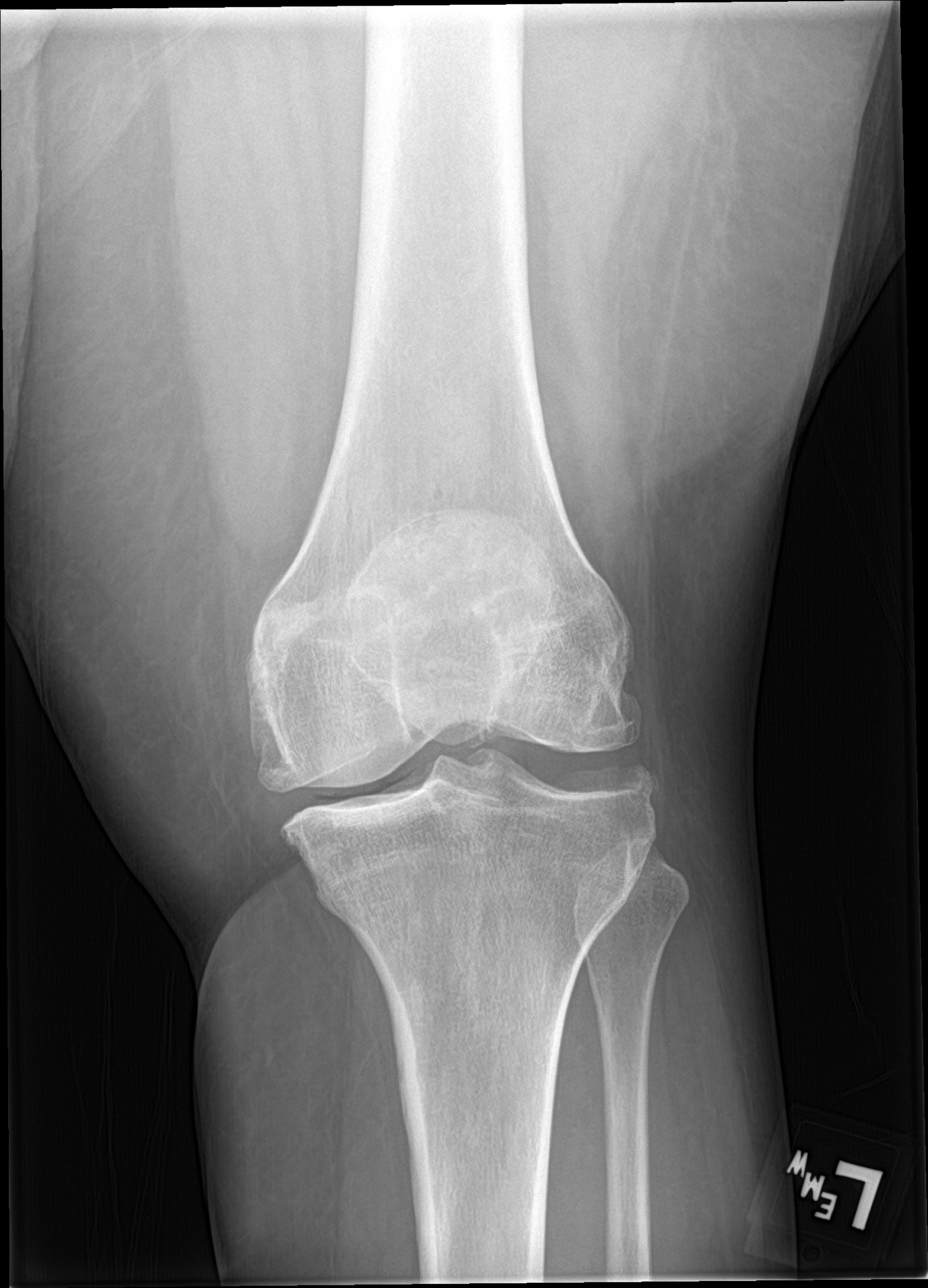

[knee lat]
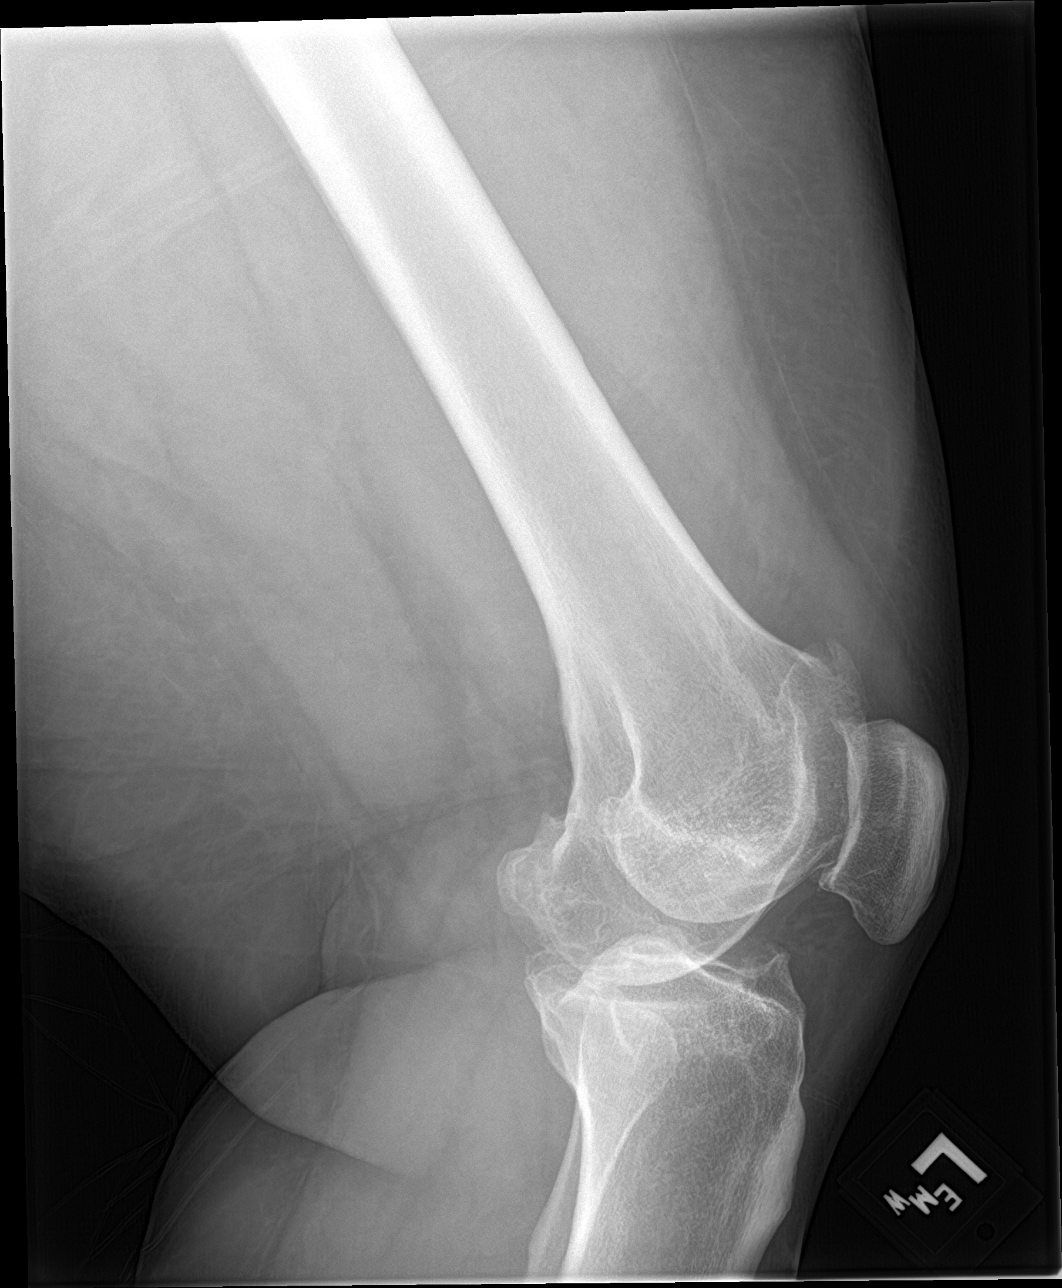

[knee obl (1 of 2)]
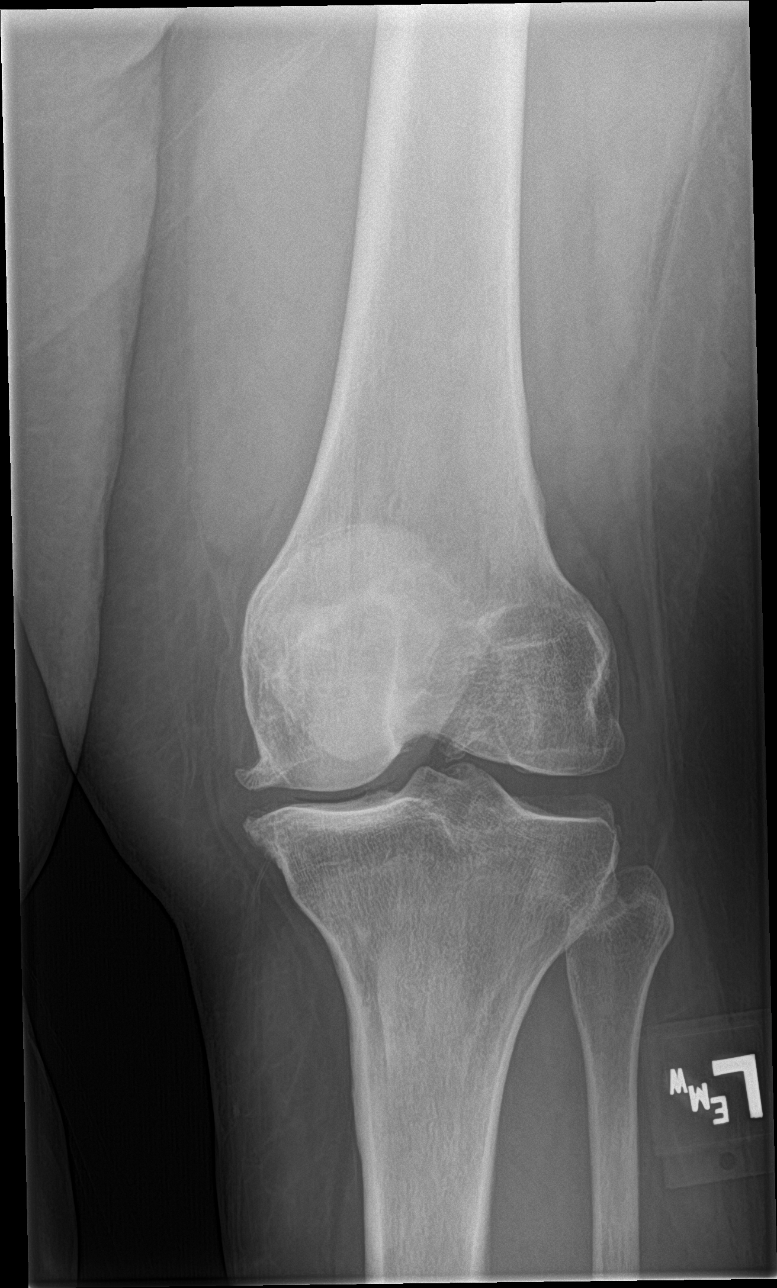

[knee obl (2 of 2)]
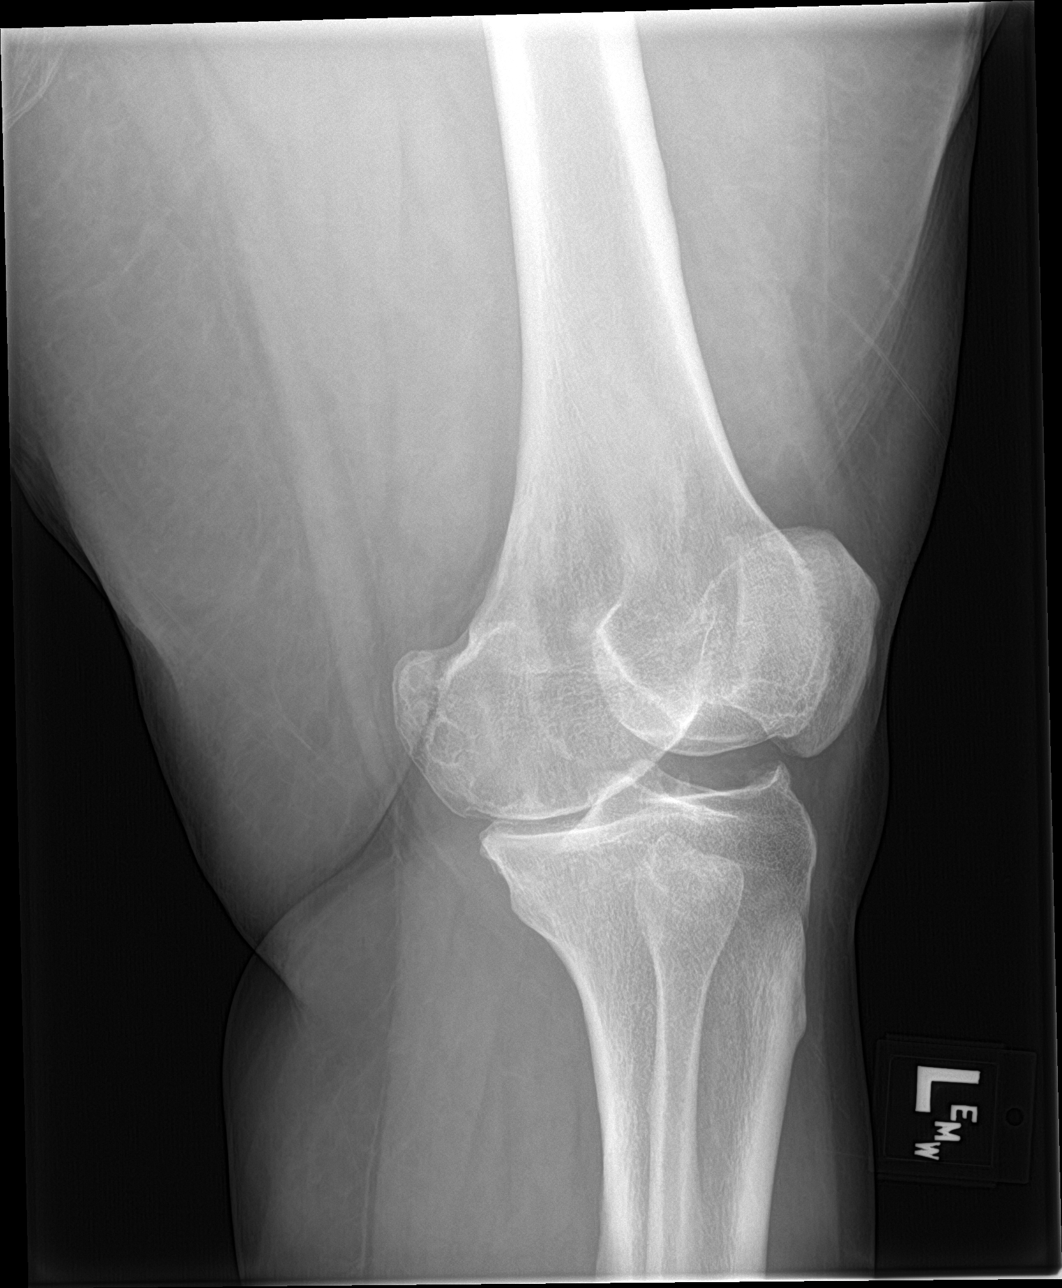

[4 of 4 positions shown; findings below may reference images not displayed]

FINDINGS: No evidence of fracture, dislocation, or joint effusion.
Tricompartmental osteoarthritis with medial joint space narrowing
and marginal osteophyte formation in all 3 compartments.
IMPRESSION: No acute abnormality.  Tricompartmental osteoarthritis.

## 2019-09-04 ENCOUNTER — Emergency Department (HOSPITAL_BASED_OUTPATIENT_CLINIC_OR_DEPARTMENT_OTHER)
Admission: EM | Admit: 2019-09-04 | Discharge: 2019-09-04 | Disposition: A | Discharge status: Home / Self Care | Payer: 59 | Attending: Emergency Medicine | Admitting: Emergency Medicine

## 2019-09-04 ENCOUNTER — Other Ambulatory Visit: Payer: Self-pay

## 2019-09-04 ENCOUNTER — Emergency Department (HOSPITAL_BASED_OUTPATIENT_CLINIC_OR_DEPARTMENT_OTHER): Payer: 59

## 2019-09-04 ENCOUNTER — Encounter (HOSPITAL_BASED_OUTPATIENT_CLINIC_OR_DEPARTMENT_OTHER): Payer: Self-pay

## 2019-09-04 DIAGNOSIS — Z91040 Latex allergy status: Secondary | ICD-10-CM | POA: Insufficient documentation

## 2019-09-04 DIAGNOSIS — R0789 Other chest pain: Secondary | ICD-10-CM | POA: Diagnosis present

## 2019-09-04 DIAGNOSIS — T50905A Adverse effect of unspecified drugs, medicaments and biological substances, initial encounter: Secondary | ICD-10-CM | POA: Insufficient documentation

## 2019-09-04 DIAGNOSIS — Z79899 Other long term (current) drug therapy: Secondary | ICD-10-CM | POA: Diagnosis not present

## 2019-09-04 LAB — CBC WITH DIFFERENTIAL/PLATELET
Abs Immature Granulocytes: 0.01 10*3/uL (ref 0.00–0.07)
Basophils Absolute: 0 10*3/uL (ref 0.0–0.1)
Basophils Relative: 1 %
Eosinophils Absolute: 0 10*3/uL (ref 0.0–0.5)
Eosinophils Relative: 1 %
HCT: 37 % (ref 36.0–46.0)
Hemoglobin: 11.9 g/dL — ABNORMAL LOW (ref 12.0–15.0)
Immature Granulocytes: 0 %
Lymphocytes Relative: 44 %
Lymphs Abs: 2.6 10*3/uL (ref 0.7–4.0)
MCH: 29.8 pg (ref 26.0–34.0)
MCHC: 32.2 g/dL (ref 30.0–36.0)
MCV: 92.5 fL (ref 80.0–100.0)
Monocytes Absolute: 0.7 10*3/uL (ref 0.1–1.0)
Monocytes Relative: 12 %
Neutro Abs: 2.5 10*3/uL (ref 1.7–7.7)
Neutrophils Relative %: 42 %
Platelets: 276 10*3/uL (ref 150–400)
RBC: 4 MIL/uL (ref 3.87–5.11)
RDW: 14 % (ref 11.5–15.5)
WBC: 6 10*3/uL (ref 4.0–10.5)
nRBC: 0 % (ref 0.0–0.2)

## 2019-09-04 LAB — BASIC METABOLIC PANEL
Anion gap: 11 (ref 5–15)
BUN: 16 mg/dL (ref 6–20)
CO2: 19 mmol/L — ABNORMAL LOW (ref 22–32)
Calcium: 8.6 mg/dL — ABNORMAL LOW (ref 8.9–10.3)
Chloride: 106 mmol/L (ref 98–111)
Creatinine, Ser: 0.81 mg/dL (ref 0.44–1.00)
GFR calc Af Amer: >60 mL/min (ref >60)
GFR calc non Af Amer: >60 mL/min (ref >60)
Glucose, Bld: 84 mg/dL (ref 70–99)
Potassium: 3.7 mmol/L (ref 3.5–5.1)
Sodium: 136 mmol/L (ref 135–145)

## 2019-09-04 MED ORDER — LORAZEPAM 1 MG PO TABS
1.0000 mg | ORAL_TABLET | Freq: Once | ORAL | Status: AC
  Administered 2019-09-04: 02:00:00 1 mg via ORAL
  Filled 2019-09-04: qty 1

## 2019-09-04 MED ORDER — ACETAMINOPHEN 325 MG PO TABS
650.0000 mg | ORAL_TABLET | Freq: Once | ORAL | Status: AC
  Administered 2019-09-04: 650 mg via ORAL
  Filled 2019-09-04: qty 2

## 2019-09-04 NOTE — ED Provider Notes (Signed)
Emergency Department Provider Note   I have reviewed the triage vital signs and the nursing notes.   HISTORY  Chief Complaint No chief complaint on file.   HPI Kristina Gonzalez is a 44 y.o. female who presents the emergency department today for what sounds like a medication reaction.  Patient is on a weight loss regimen recently increase her Topamax.  Within about 30 to 45 minutes of taking her Topamax she will  start having chest pain, palpitations, shortness of breath, increased anxiety and feeling of uneasiness.  This lasted for couple hours and progressively improved over a few hours.  Patient states that this happened last night and is happened again right now.  No other associated symptoms.  No fevers or cough.  No other associated or modifying symptoms.    History reviewed. No pertinent past medical history.  Patient Active Problem List   Diagnosis Date Noted  . Major depressive disorder, recurrent episode, moderate ([place]) 07/14/2018  . GAD (generalized anxiety disorder) 07/14/2018  . Bilateral knee pain 10/08/2017  . Protein-calorie malnutrition ([place]) 02/10/2015  . S/P bariatric surgery 02/10/2015  . Morbid obesity due to excess calories ([place]) 09/21/2014    Past Surgical History:  Procedure Laterality Date  . CESAREAN SECTION    . LAPAROSCOPIC GASTRIC SLEEVE RESECTION    . TUBAL LIGATION      Current Outpatient Rx  . Order #: [id number] Class: Historical Med  . Order #: [id number] Class: Historical Med  . Order #: [id number] Class: Print    Allergies Codeine, Latex, and Nsaids  No family history on file.  Social History Social History   Tobacco Use  . Smoking status: Never Smoker  . Smokeless tobacco: Never Used  Substance Use Topics  . Alcohol use: Not Currently  . Drug use: No    Review of Systems  All other systems negative except as documented in the HPI. All pertinent positives and negatives as reviewed in the  HPI. ____________________________________________   PHYSICAL EXAM:  VITAL SIGNS: ED Triage Vitals  Enc Vitals Group     BP 09/04/19 0018 123/88     Pulse Rate 09/04/19 0018 85     Resp 09/04/19 0018 20     Temp 09/04/19 0018 99 F (37.2 C)     Temp Source 09/04/19 0018 Oral     SpO2 09/04/19 0018 100 %     Weight 09/04/19 0020 218 lb (98.9 kg)     Height 09/04/19 0020 5\' 6"  (1.676 m)    Constitutional: Alert and oriented. Well appearing and in no acute distress. Eyes: Conjunctivae are normal. PERRL. EOMI. Head: Atraumatic. Nose: No congestion/rhinnorhea. Mouth/Throat: Mucous membranes are moist.  Oropharynx non-erythematous. Neck: No stridor.  No meningeal signs.   Cardiovascular: Normal rate, regular rhythm. Good peripheral circulation. Grossly normal heart sounds.   Respiratory: Normal respiratory effort.  No retractions. Lungs CTAB. Gastrointestinal: Soft and nontender. No distention.  Musculoskeletal: No lower extremity tenderness nor edema. No gross deformities of extremities. Neurologic:  Normal speech and language. No gross focal neurologic deficits are appreciated.  Skin:  Skin is warm, dry and intact. No rash noted.   ____________________________________________   LABS (all labs ordered are listed, but only abnormal results are displayed)  Labs Reviewed  CBC WITH DIFFERENTIAL/PLATELET - Abnormal; Notable for the following components:      Result Value   Hemoglobin 11.9 (*)    All other components within normal limits  BASIC METABOLIC PANEL - Abnormal; Notable for the following components:  CO2 19 (*)    Calcium 8.6 (*)    All other components within normal limits   ____________________________________________  EKG   EKG Interpretation  Date/Time:  Saturday September 04 2019 00:19:25 EST Ventricular Rate:  92 PR Interval:  138 QRS Duration: 86 QT Interval:  352 QTC Calculation: 435 R Axis:   42 Text Interpretation: Normal sinus rhythm Normal ECG No  old tracing to compare Confirmed by Marily Memos [phone]) on 09/04/2019 1:30:52 AM       ____________________________________________  RADIOLOGY  DG Chest Portable 1 View  Result Date: 09/04/2019 CLINICAL DATA:  Shortness of breath EXAM: PORTABLE CHEST 1 VIEW COMPARISON:  None. FINDINGS: The heart size and mediastinal contours are within normal limits. Both lungs are clear. The visualized skeletal structures are unremarkable. IMPRESSION: No active disease. Electronically Signed   By: Deatra Robinson M.D.   On: 09/04/2019 02:56    ____________________________________________   PROCEDURES  Procedure(s) performed:   Procedures   ____________________________________________   INITIAL IMPRESSION / ASSESSMENT AND PLAN / ED COURSE  Patient with likely medication reaction/adverse effect.  She improved with Ativan here in the emergency room.  Work-up is reassuring.  Low suspicion for pulmonary bleeds, ACS or other life-threatening issue at this time.  Patient stable for PCP follow-up will hold her Topamax until speaks to them.     Pertinent labs & imaging results that were available during my care of the patient were reviewed by me and considered in my medical decision making (see chart for details).   A medical screening exam was performed and I feel the patient has had an appropriate workup for their chief complaint at this time and likelihood of emergent condition existing is low. They have been counseled on decision, discharge, follow up and which symptoms necessitate immediate return to the emergency department. They or their family verbally stated understanding and agreement with plan and discharged in stable condition.   ____________________________________________  FINAL CLINICAL IMPRESSION(S) / ED DIAGNOSES  Final diagnoses:  Adverse effect of drug, initial encounter     MEDICATIONS GIVEN DURING THIS VISIT:  Medications  LORazepam (ATIVAN) tablet 1 mg (1 mg Oral Given  09/04/19 0229)  acetaminophen (TYLENOL) tablet 650 mg (650 mg Oral Given 09/04/19 0343)     NEW OUTPATIENT MEDICATIONS STARTED DURING THIS VISIT:  Discharge Medication List as of 09/04/2019  3:26 AM      Note:  This note was prepared with assistance of Dragon voice recognition software. Occasional wrong-word or sound-a-like substitutions may have occurred due to the inherent limitations of voice recognition software.   Marily Memos, MD 09/04/19 2308

## 2019-09-04 NOTE — ED Triage Notes (Signed)
Pt had gastric bypass 2 months ago. Pt was on phenteramine, and then was placed on Topamax and increased her dose yesterday per her MDs order. When pt took her dose tonight pt is reporting anxiety and chest tightness. Denies ShOB.

## 2020-01-01 ENCOUNTER — Emergency Department (HOSPITAL_BASED_OUTPATIENT_CLINIC_OR_DEPARTMENT_OTHER)
Admission: EM | Admit: 2020-01-01 | Discharge: 2020-01-01 | Disposition: A | Discharge status: Home / Self Care | Payer: 59 | Attending: Emergency Medicine | Admitting: Emergency Medicine

## 2020-01-01 ENCOUNTER — Other Ambulatory Visit: Payer: Self-pay

## 2020-01-01 ENCOUNTER — Encounter (HOSPITAL_BASED_OUTPATIENT_CLINIC_OR_DEPARTMENT_OTHER): Payer: Self-pay | Admitting: Emergency Medicine

## 2020-01-01 DIAGNOSIS — Z79899 Other long term (current) drug therapy: Secondary | ICD-10-CM | POA: Diagnosis not present

## 2020-01-01 DIAGNOSIS — W458XXA Other foreign body or object entering through skin, initial encounter: Secondary | ICD-10-CM | POA: Insufficient documentation

## 2020-01-01 DIAGNOSIS — Y929 Unspecified place or not applicable: Secondary | ICD-10-CM | POA: Insufficient documentation

## 2020-01-01 DIAGNOSIS — Z91040 Latex allergy status: Secondary | ICD-10-CM | POA: Insufficient documentation

## 2020-01-01 DIAGNOSIS — Y939 Activity, unspecified: Secondary | ICD-10-CM | POA: Insufficient documentation

## 2020-01-01 DIAGNOSIS — S01312A Laceration without foreign body of left ear, initial encounter: Secondary | ICD-10-CM | POA: Diagnosis present

## 2020-01-01 DIAGNOSIS — Y999 Unspecified external cause status: Secondary | ICD-10-CM | POA: Diagnosis not present

## 2020-01-01 NOTE — ED Provider Notes (Signed)
Below. [facility] EMERGENCY DEPARTMENT Provider Note   CSN: [id number] Arrival date & time: 01/01/20  1119     History Chief Complaint  Patient presents with  . Ear injury    Kristina Gonzalez is a 44 y.o. female.  HPI Patient is 44 year old female with no pertinent past medical history presented today with left earlobe tear injury.  She states that approximately 3 AM this morning her earring tore through the thin piece of skin that was at the bottom of her he states that it bled some but states that it stopped bleeding several hours ago.  She denies any significant pain, any other injuries, head trauma or lightheadedness or dizziness.     History reviewed. No pertinent past medical history.  Patient Active Problem List   Diagnosis Date Noted  . Major depressive disorder, recurrent episode, moderate ([place]) 07/14/2018  . GAD (generalized anxiety disorder) 07/14/2018  . Bilateral knee pain 10/08/2017  . Protein-calorie malnutrition ([place]) 02/10/2015  . S/P bariatric surgery 02/10/2015  . Morbid obesity due to excess calories ([place]) 09/21/2014    Past Surgical History:  Procedure Laterality Date  . CESAREAN SECTION    . LAPAROSCOPIC GASTRIC SLEEVE RESECTION    . TUBAL LIGATION       OB History   No obstetric history on file.     No family history on file.  Social History   Tobacco Use  . Smoking status: Never Smoker  . Smokeless tobacco: Never Used  Substance Use Topics  . Alcohol use: Not Currently  . Drug use: No    Home Medications Prior to Admission medications   Medication Sig Start Date End Date Taking? Authorizing Provider  topiramate (TOPAMAX) 50 MG tablet Take 50 mg by mouth 2 (two) times daily.  09/04/19  [provider]  buPROPion (WELLBUTRIN XL) 300 MG 24 hr tablet Take 300 mg by mouth daily.    [provider]  cyclobenzaprine (FLEXERIL) 10 MG tablet Take 1 tablet (10 mg total) by mouth 2 (two) times daily as needed for  muscle spasms. 07/16/18   Robinson, [place] N, PA-C  phentermine 37.5 MG capsule Take 37.5 mg by mouth every morning.    [provider]    Allergies    Codeine, Latex, and Nsaids  Review of Systems   Review of Systems  Constitutional: Negative for fever.  HENT: Negative for congestion.   Respiratory: Negative for shortness of breath.   Cardiovascular: Negative for chest pain.  Gastrointestinal: Negative for abdominal distention.  Skin:       Earlobe pain  Neurological: Negative for dizziness and headaches.    Physical Exam Updated Vital Signs BP 134/69 (BP Location: Left Arm)   Pulse 68   Temp 98.2 F (36.8 C) (Oral)   Resp 18   Ht 5\' 6"  (1.676 m)   Wt 99.8 kg   LMP 12/22/2019   SpO2 100%   BMI 35.51 kg/m   Physical Exam Vitals and nursing note reviewed.  Constitutional:      General: She is not in acute distress.    Appearance: Normal appearance. She is not ill-appearing.  HENT:     Head: Normocephalic and atraumatic.  Eyes:     General: No scleral icterus.       Right eye: No discharge.        Left eye: No discharge.     Conjunctiva/sclera: Conjunctivae normal.  Pulmonary:     Effort: Pulmonary effort is normal.  Breath sounds: No stridor.  Skin:    Comments: Left earlobe with well-healed gauge hole present.  At the very bottom of this area the small, few millimeter wide area of skin has broken likely due to earring pulling through.  There is no active bleeding.  No redness or tenderness to palpation.  Neurological:     Mental Status: She is alert and oriented to person, place, and time. Mental status is at baseline.     ED Results / Procedures / Treatments   Labs (all labs ordered are listed, but only abnormal results are displayed) Labs Reviewed - No data to display  EKG None  Radiology No results found.  Procedures Procedures (including critical care time)  Medications Ordered in ED Medications - No data to display  ED Course    I have reviewed the triage vital signs and the nursing notes.  Pertinent labs & imaging results that were available during my care of the patient were reviewed by me and considered in my medical decision making (see chart for details).    MDM Rules/Calculators/A&P                      Patient presents today for care of a very small amount of skin below her ear gauge that was intact.  She states that approximately this happened at 3 AM was approximately 9 hours ago.   No negation to suture this area as there is very little tissue to suture.  I recommend follow-up with plastic surgery.  She is agreeable to this.  No active bleeding at this time.  She is well-appearing will discharge with return precautions for signs of infection.  Final Clinical Impression(s) / ED Diagnoses Final diagnoses:  Tear of left earlobe, initial encounter    Rx / DC Orders ED Discharge Orders    None       Gailen Shelter, [place] 01/01/20 1333    Pollyann Savoy, MD 01/01/20 [phone]

## 2020-01-01 NOTE — ED Triage Notes (Signed)
States her L earring got hung and tore her earlobe.

## 2020-01-01 NOTE — Discharge Instructions (Addendum)
Please keep area clean and dry.  You may clean it with warm soapy water.  Please follow-up with plastic surgery for further management of this.  If you have any signs of infection such as warmth, redness, swelling, worsening pain please return to ED or see your primary care doctor as this may require antibiotic treatment.

## 2020-01-12 ENCOUNTER — Ambulatory Visit (INDEPENDENT_AMBULATORY_CARE_PROVIDER_SITE_OTHER): Payer: Self-pay | Admitting: Plastic Surgery

## 2020-01-12 ENCOUNTER — Other Ambulatory Visit: Payer: Self-pay

## 2020-01-12 VITALS — BP 119/78 | HR 84 | Temp 98.5°F

## 2020-01-12 DIAGNOSIS — Z411 Encounter for cosmetic surgery: Secondary | ICD-10-CM

## 2020-01-12 NOTE — Progress Notes (Signed)
   Referring Provider Center, [facility] [address] [place],  [place] [address]   CC: No chief complaint on file.    Split earlobes  Kristina Gonzalez is an 44 y.o. female.  HPI: Patient presents with bilateral split earlobes.  The left one is split all the way through.  The right 1 is almost all the way through.  It bothers her and she wants them corrected.  Allergies  Allergen Reactions  . Codeine     hives  . Latex     rash  . Nsaids Other (See Comments)    Gastric sleeve    Outpatient Encounter Medications as of 01/12/2020  Medication Sig  . [DISCONTINUED] topiramate (TOPAMAX) 50 MG tablet Take 50 mg by mouth 2 (two) times daily.  Marland Kitchen buPROPion (WELLBUTRIN XL) 300 MG 24 hr tablet Take 300 mg by mouth daily.  . cyclobenzaprine (FLEXERIL) 10 MG tablet Take 1 tablet (10 mg total) by mouth 2 (two) times daily as needed for muscle spasms. (Patient not taking: Reported on 01/12/2020)  . phentermine 37.5 MG capsule Take 37.5 mg by mouth every morning.   No facility-administered encounter medications on file as of 01/12/2020.     No past medical history on file.  Past Surgical History:  Procedure Laterality Date  . CESAREAN SECTION    . LAPAROSCOPIC GASTRIC SLEEVE RESECTION    . TUBAL LIGATION      No family history on file.  Social History   Social History Narrative  . Not on file     Review of Systems General: Denies fevers, chills, weight loss CV: Denies chest pain, shortness of breath, palpitations  Physical Exam Vitals with BMI 01/12/2020 01/01/2020 01/01/2020  Height - - -  Weight - - -  BMI - - -  Systolic 119 96 134  Diastolic 78 75 69  Pulse 84 68 68    General:  No acute distress,  Alert and oriented, Non-Toxic, Normal speech and affect Left earlobe is split all the way down.  The right earlobe hole is dilated and almost all the way split.  Assessment/Plan Patient has bilateral split earlobes.  We discussed repair.  We discussed the risk that include  bleeding, infection, damage to surrounding structures, need for additional procedures.  We discussed the potential for recurrence.  We discussed the chance for small irregularities.  We discussed the need to wait a while before repiercing the ears.  We will plan to get this scheduled under local.  Kristina Gonzalez 01/12/2020, 1:08 PM

## 2020-01-17 ENCOUNTER — Ambulatory Visit (INDEPENDENT_AMBULATORY_CARE_PROVIDER_SITE_OTHER): Payer: Self-pay | Admitting: Plastic Surgery

## 2020-01-17 ENCOUNTER — Other Ambulatory Visit: Payer: Self-pay

## 2020-01-17 ENCOUNTER — Encounter: Payer: Self-pay | Admitting: Plastic Surgery

## 2020-01-17 VITALS — BP 124/83 | Temp 96.9°F | Ht 66.0 in | Wt 220.0 lb

## 2020-01-17 DIAGNOSIS — Z411 Encounter for cosmetic surgery: Secondary | ICD-10-CM

## 2020-01-17 NOTE — Progress Notes (Signed)
Operative Note   DATE OF OPERATION: 01/17/2020  LOCATION:    SURGICAL DEPARTMENT: Plastic Surgery  PREOPERATIVE DIAGNOSES: Bilateral split earlobes  POSTOPERATIVE DIAGNOSES:  same  PROCEDURE:  1. Bilateral split earlobe repair  SURGEON: Ancil Linsey, MD  ANESTHESIA:  Local  COMPLICATIONS: None.   INDICATIONS FOR PROCEDURE:  The patient, Kristina Gonzalez is a 44 y.o. female born on [birth year], is here for treatment of bilateral split earlobes MRN: [id number]  CONSENT:  Informed consent was obtained directly from the patient. Risks, benefits and alternatives were fully discussed. Specific risks including but not limited to bleeding, infection, hematoma, seroma, scarring, pain, infection, wound healing problems, and need for further surgery were all discussed. The patient did have an ample opportunity to have questions answered to satisfaction.   DESCRIPTION OF PROCEDURE:  Local anesthesia was administered. The patient's operative site was prepped and draped in a sterile fashion. A time out was performed and all information was confirmed to be correct.  The splits were excised with an 11 blade.  Hemostasis was obtained.  Closure was done with Prolene sutures for the anterior aspect of the ear and fast gut sutures for the posterior aspect.  She tolerated the procedure well and had a good on table result. The patient tolerated the procedure well.  There were no complications.

## 2020-02-01 NOTE — Progress Notes (Signed)
Patient is a 44 year old female here for follow-up after undergoing bilateral split earlobe repair on 01/17/2020 with Dr. Arita Miss.  ~ 2 weeks Patient reports overall she is doing well.  Reports slight irritation of the ears as her hair sometimes gets caught along the suture tails.  Incisions are healing well, C/D/I.  No signs of infection, redness, drainage, seroma/hematoma.  Sutures removed.  Denies fever, nausea/vomiting.  May apply Vaseline to the incision and surrounding skin.  Follow-up as needed.  Call office of any questions/concerns.

## 2020-02-03 ENCOUNTER — Encounter: Payer: Self-pay | Admitting: Plastic Surgery

## 2020-02-03 ENCOUNTER — Ambulatory Visit (INDEPENDENT_AMBULATORY_CARE_PROVIDER_SITE_OTHER): Payer: 59 | Admitting: Plastic Surgery

## 2020-02-03 ENCOUNTER — Other Ambulatory Visit: Payer: Self-pay

## 2020-02-03 VITALS — BP 124/85 | HR 75 | Temp 97.1°F

## 2020-02-03 DIAGNOSIS — S01311D Laceration without foreign body of right ear, subsequent encounter: Secondary | ICD-10-CM

## 2020-02-03 DIAGNOSIS — S01312D Laceration without foreign body of left ear, subsequent encounter: Secondary | ICD-10-CM

## 2020-03-13 ENCOUNTER — Telehealth: Payer: Self-pay | Admitting: Psychiatry

## 2020-03-13 NOTE — Telephone Encounter (Signed)
Disability papers received from [place]. Given to nurse.

## 2020-03-30 ENCOUNTER — Ambulatory Visit: Payer: 59 | Admitting: Psychiatry

## 2020-07-23 ENCOUNTER — Encounter (HOSPITAL_BASED_OUTPATIENT_CLINIC_OR_DEPARTMENT_OTHER): Payer: Self-pay | Admitting: Emergency Medicine

## 2020-07-23 ENCOUNTER — Other Ambulatory Visit: Payer: Self-pay

## 2020-07-23 ENCOUNTER — Emergency Department (HOSPITAL_BASED_OUTPATIENT_CLINIC_OR_DEPARTMENT_OTHER)
Admission: EM | Admit: 2020-07-23 | Discharge: 2020-07-23 | Disposition: A | Discharge status: Home / Self Care | Payer: 59 | Attending: Emergency Medicine | Admitting: Emergency Medicine

## 2020-07-23 DIAGNOSIS — M7918 Myalgia, other site: Secondary | ICD-10-CM

## 2020-07-23 DIAGNOSIS — X500XXA Overexertion from strenuous movement or load, initial encounter: Secondary | ICD-10-CM | POA: Insufficient documentation

## 2020-07-23 DIAGNOSIS — Z91040 Latex allergy status: Secondary | ICD-10-CM | POA: Diagnosis not present

## 2020-07-23 DIAGNOSIS — S46912A Strain of unspecified muscle, fascia and tendon at shoulder and upper arm level, left arm, initial encounter: Secondary | ICD-10-CM | POA: Diagnosis not present

## 2020-07-23 DIAGNOSIS — Y9389 Activity, other specified: Secondary | ICD-10-CM | POA: Insufficient documentation

## 2020-07-23 DIAGNOSIS — M545 Low back pain, unspecified: Secondary | ICD-10-CM | POA: Insufficient documentation

## 2020-07-23 DIAGNOSIS — M549 Dorsalgia, unspecified: Secondary | ICD-10-CM

## 2020-07-23 DIAGNOSIS — S4992XA Unspecified injury of left shoulder and upper arm, initial encounter: Secondary | ICD-10-CM | POA: Diagnosis present

## 2020-07-23 LAB — PREGNANCY, URINE: Preg Test, Ur: NEGATIVE

## 2020-07-23 MED ORDER — ACETAMINOPHEN 500 MG PO TABS
1000.0000 mg | ORAL_TABLET | Freq: Once | ORAL | Status: AC
  Administered 2020-07-23: 1000 mg via ORAL
  Filled 2020-07-23: qty 2

## 2020-07-23 MED ORDER — PREDNISONE 20 MG PO TABS: 40.0000 mg | ORAL_TABLET | Freq: Every day | ORAL | 0 refills | Status: AC

## 2020-07-23 MED ORDER — METHOCARBAMOL 500 MG PO TABS: 500.0000 mg | ORAL_TABLET | Freq: Two times a day (BID) | ORAL | 0 refills | Status: DC

## 2020-07-23 NOTE — Discharge Instructions (Signed)
You can take 1000 mg of Tylenol.  Do not exceed 4000 mg of Tylenol a day.  Take Robaxin as prescribed. This medication will make you drowsy so do not drive or drink alcohol when taking it.  Take prednisone as directed.  You can use a sling for support and stabilization.  Do not keep it in their 24 hours a day as this can cause frozen shoulder syndrome.  If her symptoms do not improve or they worsen, I have provided you a referral to outpatient orthopedics that you can follow-up with.  Return the emergency department for any worsening pain, numbness/weakness of your arm, fevers or any other worsening concerning symptoms.

## 2020-07-23 NOTE — ED Provider Notes (Signed)
[facility] EMERGENCY DEPARTMENT Provider Note   CSN: [id number] Arrival date & time: 07/23/20  1042     History Chief Complaint  Patient presents with  . Back Pain  . Shoulder Pain    Kristina Gonzalez is a 44 y.o. female past medical history of depression, generalized anxiety, bariatric surgery who presents for evaluation of left shoulder, left back pain that began 2 days ago.  She reports that about 2 days ago, she was helping her kids pack and states she lifted a very heavy suitcase.  She states when she did it, she felt like she pulled something in her left back/shoulders.  Reports since then, she has had pain to that area.  She states is on the left side of her neck and goes into her back and into her shoulder.  It is worse when she tries to move or bend her arm.  She states that she has been taking Tylenol with minimal improvement.  She denies any fall.  She does admit typically does not have any back issues.  She denies any history of back surgery, history of IV drug use, numbness/weakness of her arms or legs.  The history is provided by the patient.       History reviewed. No pertinent past medical history.  Patient Active Problem List   Diagnosis Date Noted  . Major depressive disorder, recurrent episode, moderate (HCC) 07/14/2018  . GAD (generalized anxiety disorder) 07/14/2018  . Bilateral knee pain 10/08/2017  . Protein-calorie malnutrition (HCC) 02/10/2015  . S/P bariatric surgery 02/10/2015  . Morbid obesity due to excess calories (HCC) 09/21/2014    Past Surgical History:  Procedure Laterality Date  . CESAREAN SECTION    . LAPAROSCOPIC GASTRIC SLEEVE RESECTION    . TUBAL LIGATION       OB History   No obstetric history on file.     No family history on file.  Social History   Tobacco Use  . Smoking status: Never Smoker  . Smokeless tobacco: Never Used  Vaping Use  . Vaping Use: Never used  Substance Use Topics  . Alcohol use: Not  Currently  . Drug use: No    Home Medications Prior to Admission medications   Medication Sig Start Date End Date Taking? Authorizing Provider  buPROPion (WELLBUTRIN XL) 300 MG 24 hr tablet Take 300 mg by mouth daily.    [provider]  cyclobenzaprine (FLEXERIL) 10 MG tablet Take 1 tablet (10 mg total) by mouth 2 (two) times daily as needed for muscle spasms. 07/16/18   Robinson, [place] N, PA-C  methocarbamol (ROBAXIN) 500 MG tablet Take 1 tablet (500 mg total) by mouth 2 (two) times daily. 07/23/20   Maxwell Caul, PA-C  phentermine 37.5 MG capsule Take 37.5 mg by mouth every morning.    [provider]  predniSONE (DELTASONE) 20 MG tablet Take 2 tablets (40 mg total) by mouth daily for 4 days. 07/23/20 07/27/20  Maxwell Caul, PA-C  topiramate (TOPAMAX) 50 MG tablet Take 50 mg by mouth 2 (two) times daily.  09/04/19  [provider]    Allergies    Codeine, Latex, and Nsaids  Review of Systems   Review of Systems  Constitutional: Negative for fever.  Cardiovascular: Negative for chest pain.  Gastrointestinal: Negative for abdominal pain, nausea and vomiting.  Musculoskeletal: Positive for back pain.       Left shoulder pain  Neurological: Negative for weakness and numbness.  All other systems reviewed  and are negative.   Physical Exam Updated Vital Signs BP (!) 136/91 (BP Location: Left Wrist)   Pulse 63   Temp 98.3 F (36.8 C) (Oral)   Resp 18   Ht 5\' 6"  (1.676 m)   Wt 99.8 kg   LMP 07/23/2020   SpO2 100%   BMI 35.51 kg/m   Physical Exam Vitals and nursing note reviewed.  Constitutional:      Appearance: She is well-developed.  HENT:     Head: Normocephalic and atraumatic.     Comments: Full flexion/extension and lateral movement of neck fully intact. No bony midline tenderness. No deformities or crepitus.  Diffuse muscular tenderness noted to left paraspinal muscles that extend into the trapezius. Eyes:     General: No scleral  icterus.       Right eye: No discharge.        Left eye: No discharge.     Conjunctiva/sclera: Conjunctivae normal.  Cardiovascular:     Pulses:          Radial pulses are 2+ on the right side and 2+ on the left side.  Pulmonary:     Effort: Pulmonary effort is normal.  Musculoskeletal:       Back:     Comments: Diffuse muscular tenderness noted to the left trapezius and paraspinal muscles that extends into the shoulder.  No midline T or L-spine tenderness.  Abduction and abduction intact but she does have some pain with doing so.  No bony tenderness noted to left shoulder.  She has pain with Neer's impingement, Hawkins, empty can test.  No bony tenderness of the elbow, forearm.  Skin:    General: Skin is warm and dry.     Capillary Refill: Capillary refill takes less than 2 seconds.     Comments: Good distal cap refill.  LUE is not dusky in appearance or cool to touch.  Neurological:     Mental Status: She is alert.     Comments: Sensation intact along major nerve distributions of BUE Equal grip strength bilaterally.  5/5 strength bilateral upper extremities.  Normal motor of lower extremities.  Psychiatric:        Speech: Speech normal.        Behavior: Behavior normal.     ED Results / Procedures / Treatments   Labs (all labs ordered are listed, but only abnormal results are displayed) Labs Reviewed  PREGNANCY, URINE    EKG None  Radiology No results found.  Procedures Procedures (including critical care time)  Medications Ordered in ED Medications  acetaminophen (TYLENOL) tablet 1,000 mg (1,000 mg Oral Given 07/23/20 1245)    ED Course  I have reviewed the triage vital signs and the nursing notes.  Pertinent labs & imaging results that were available during my care of the patient were reviewed by me and considered in my medical decision making (see chart for details).    MDM Rules/Calculators/A&P                          44 year old female who presents for  evaluation of left shoulder, left back pain that began 2 days ago after lifting something heavy.  No numbness/weakness.  No falls.  On initially arrival, she is afebrile nontoxic-appearing.  Vital signs are stable.  She is neurovascularly intact.  On exam, she has tenderness palpation of the paraspinal muscles of the left surgical region that extend into the trapezius on the shoulder.  She  does have pain with Neer's impingement, Hawkins, empty can test.  I discussed with her that this could be musculoskeletal any other etiology.  We also discussed the probability of rotator cuff impingement given positive test here in the ED.  History/physical exam is not concerning for fracture dislocation given lack of fall, trauma, injury.  History/physical exam concerning for cauda equina.  This time, no indication for x-ray or emergent MRI imaging given reassuring exam.  We will plan to treat with muscle relaxers, short course of prednisone for inflammation.  Patient given a sling for support and stabilization.  Encouraged her to follow-up with orthopedics if symptoms worsen. At this time, patient exhibits no emergent life-threatening condition that require further evaluation in ED. Patient had ample opportunity for questions and discussion. All patient's questions were answered with full understanding. Strict return precautions discussed. Patient expresses understanding and agreement to plan.   Portions of this note were generated with Scientist, clinical (histocompatibility and immunogenetics). Dictation errors may occur despite best attempts at proofreading.   Final Clinical Impression(s) / ED Diagnoses Final diagnoses:  Strain of left shoulder, initial encounter  Acute left-sided back pain, unspecified back location  Musculoskeletal pain    Rx / DC Orders ED Discharge Orders         Ordered    methocarbamol (ROBAXIN) 500 MG tablet  2 times daily        07/23/20 1203    predniSONE (DELTASONE) 20 MG tablet  Daily        07/23/20 1203            Rosana Hoes 07/23/20 1319    Derwood Kaplan, MD 07/24/20 [phone]

## 2020-07-23 NOTE — ED Triage Notes (Signed)
Pt reports lifting heavy objects causing pain to back and left shoulder onset Friday.

## 2020-07-23 NOTE — ED Notes (Signed)
ED Provider at bedside. 

## 2020-07-23 NOTE — ED Notes (Signed)
Pt is driving and doesn't wanna wear sling home, but I explained how to attach appropriately , she understood all directions and has family members that will help place on pt when she is home. Unsafe to drive with attached.

## 2020-07-26 ENCOUNTER — Encounter: Payer: Self-pay | Admitting: Plastic Surgery

## 2020-07-26 ENCOUNTER — Ambulatory Visit (INDEPENDENT_AMBULATORY_CARE_PROVIDER_SITE_OTHER): Payer: Self-pay | Admitting: Plastic Surgery

## 2020-07-26 ENCOUNTER — Other Ambulatory Visit: Payer: Self-pay

## 2020-07-26 VITALS — BP 137/9 | Temp 98.1°F

## 2020-07-26 DIAGNOSIS — Z411 Encounter for cosmetic surgery: Secondary | ICD-10-CM

## 2020-07-26 NOTE — Progress Notes (Signed)
Patient presents after bilateral earlobe repair.  She is overall happy with how things a field and is interested in getting her ears reappears.  On exam the earlobes look to be repaired nicely.  I marked out to new piercing locations 1 on each ear.  I measured these to ensure symmetry and she checked them as well herself in the mirror to ensure that they are in the propria location and one that she likes.  I then prepped both earlobes with an alcohol pad and inserted new earrings which she tolerated fine.  I have asked her to leave these in for at least a few weeks to allow the hole to epithelialize.  All of her questions were answered and we will see her again on an as-needed basis.

## 2020-09-15 ENCOUNTER — Other Ambulatory Visit: Payer: Self-pay

## 2020-09-15 ENCOUNTER — Ambulatory Visit (INDEPENDENT_AMBULATORY_CARE_PROVIDER_SITE_OTHER): Payer: Self-pay | Admitting: Plastic Surgery

## 2020-09-15 DIAGNOSIS — Z411 Encounter for cosmetic surgery: Secondary | ICD-10-CM

## 2020-09-15 NOTE — Progress Notes (Signed)
Patient presents for repiercing of her right earlobe.  Her child removed the right ear piercing several days ago and she tried to get it back in but was unable to do so.  I attempted to try to reinsert the earring through the piercing which was visible on the anterior surface of the earlobe but was unable to push it through.  I then prepped the area with an alcohol pad and we pierced the ear in the same location.  She tolerated this fine.  I have reminded her to leave the earrings in for longer this time to avoid the same problem.  She is fully understanding.

## 2022-03-22 ENCOUNTER — Telehealth: Payer: Self-pay | Admitting: Surgical

## 2022-03-22 NOTE — Telephone Encounter (Signed)
This patient called to ask if we provide hand rejuvenation injections to make the hands look younger. Patient is aware we offer halo laser, but prefers injections if available.  Patient has requested for reply to be sent to her via MyChart.  Please advise.   Thank you.

## 2022-03-28 ENCOUNTER — Encounter: Payer: Self-pay | Admitting: *Deleted

## 2022-04-13 ENCOUNTER — Emergency Department (HOSPITAL_BASED_OUTPATIENT_CLINIC_OR_DEPARTMENT_OTHER): Payer: 59

## 2022-04-13 ENCOUNTER — Other Ambulatory Visit: Payer: Self-pay

## 2022-04-13 ENCOUNTER — Encounter (HOSPITAL_BASED_OUTPATIENT_CLINIC_OR_DEPARTMENT_OTHER): Payer: Self-pay | Admitting: Emergency Medicine

## 2022-04-13 ENCOUNTER — Emergency Department (HOSPITAL_BASED_OUTPATIENT_CLINIC_OR_DEPARTMENT_OTHER)
Admission: EM | Admit: 2022-04-13 | Discharge: 2022-04-13 | Disposition: A | Discharge status: Home / Self Care | Payer: 59 | Attending: Emergency Medicine | Admitting: Emergency Medicine

## 2022-04-13 DIAGNOSIS — W19XXXA Unspecified fall, initial encounter: Secondary | ICD-10-CM

## 2022-04-13 DIAGNOSIS — S8991XA Unspecified injury of right lower leg, initial encounter: Secondary | ICD-10-CM | POA: Insufficient documentation

## 2022-04-13 DIAGNOSIS — Z91040 Latex allergy status: Secondary | ICD-10-CM | POA: Insufficient documentation

## 2022-04-13 DIAGNOSIS — W108XXA Fall (on) (from) other stairs and steps, initial encounter: Secondary | ICD-10-CM | POA: Insufficient documentation

## 2022-04-13 MED ORDER — DICLOFENAC SODIUM 1 % EX GEL: 4.0000 g | Freq: Four times a day (QID) | CUTANEOUS | 0 refills | Status: DC

## 2022-04-13 MED ORDER — OXYCODONE-ACETAMINOPHEN 5-325 MG PO TABS
1.0000 | ORAL_TABLET | Freq: Once | ORAL | Status: AC
  Administered 2022-04-13: 1 via ORAL
  Filled 2022-04-13: qty 1

## 2022-04-13 MED ORDER — METHOCARBAMOL 500 MG PO TABS: 500.0000 mg | ORAL_TABLET | Freq: Two times a day (BID) | ORAL | 0 refills | Status: DC

## 2022-04-13 MED ORDER — ACETAMINOPHEN 500 MG PO TABS: 1000.0000 mg | ORAL_TABLET | Freq: Four times a day (QID) | ORAL | 0 refills | Status: AC | PRN

## 2022-04-13 NOTE — ED Triage Notes (Signed)
Mechanical fall 2 days ago. C/o left shoulder pain and right knee pain. Denies loc, hitting head, neck pain, back pain. Pt in [facility] [address] states she is unable to bear weight. Otc meds with no relief.

## 2022-04-13 NOTE — ED Provider Notes (Signed)
[facility] EMERGENCY DEPARTMENT Provider Note   CSN: [id number] Arrival date & time: 04/13/22  1911     History  Chief Complaint  Patient presents with   Fall    Kristina Gonzalez is a 46 y.o. female.   Fall  Patient is a 47 year old female she states that she fell down 2 steps 3 days ago and states that her right knee twisted weirdly.  She has had some swelling in her right knee since.  She did not strike her head or lose consciousness no nausea or vomiting.  She states she also had some left shoulder pain which is significantly improved.  No other associated symptoms.  She is taken Tylenol today without significant relief.  She also was taking antihistamines which she thought would help with her pain.     Home Medications Prior to Admission medications   Medication Sig Start Date End Date Taking? Authorizing Provider  acetaminophen (TYLENOL) 500 MG tablet Take 2 tablets (1,000 mg total) by mouth every 6 (six) hours as needed. 04/13/22  Yes Mckinley Adelstein S, PA  diclofenac Sodium (VOLTAREN) 1 % GEL Apply 4 g topically 4 (four) times daily. 04/13/22  Yes Kristina Gonzalez S, PA  methocarbamol (ROBAXIN) 500 MG tablet Take 1 tablet (500 mg total) by mouth 2 (two) times daily. 04/13/22  Yes Kristina Gonzalez S, PA  buPROPion (WELLBUTRIN XL) 300 MG 24 hr tablet Take 300 mg by mouth daily.    [provider]  cyclobenzaprine (FLEXERIL) 10 MG tablet Take 1 tablet (10 mg total) by mouth 2 (two) times daily as needed for muscle spasms. 07/16/18   Robinson, [place] N, PA-C  phentermine 37.5 MG capsule Take 37.5 mg by mouth every morning.    [provider]  topiramate (TOPAMAX) 50 MG tablet Take 50 mg by mouth 2 (two) times daily.  09/04/19  [provider]      Allergies    Codeine, Latex, and Nsaids    Review of Systems   Review of Systems  Physical Exam Updated Vital Signs BP (!) 130/90   Pulse 92   Temp 98.6 F (37 C) (Oral)   Resp 18   Ht 5\' 6"   (1.676 m)   Wt 101.2 kg   LMP 03/16/2022 (Exact Date)   SpO2 100%   BMI 35.99 kg/m  Physical Exam Vitals and nursing note reviewed.  Constitutional:      General: She is not in acute distress.    Appearance: Normal appearance. She is not ill-appearing.  HENT:     Head: Normocephalic and atraumatic.  Eyes:     General: No scleral icterus.       Right eye: No discharge.        Left eye: No discharge.     Conjunctiva/sclera: Conjunctivae normal.  Pulmonary:     Effort: Pulmonary effort is normal.     Breath sounds: No stridor.  Musculoskeletal:     Comments: Patient does have swelling/positive effusion with positive ballottement to the right knee.  Is able to range lightly.  Distally neurovascularly intact with sensation and movement of toes and ankle and cap refill.  And pulses.  3+ DP PT.  No other bony tenderness over joints or long bones of the upper and lower extremities.    No neck or back midline tenderness, step-off, deformity, or bruising. Able to turn head left and right 45 degrees without difficulty.  Full range of motion of upper and lower extremity joints shown after palpation  was conducted; with 5/5 symmetrical strength in upper and lower extremities. No chest wall tenderness, no facial or cranial tenderness.   Patient has intact sensation grossly in lower and upper extremities. Intact patellar and ankle reflexes. Patient able to ambulate without difficulty.  Radial and DP pulses palpated BL.    Neurological:     Mental Status: She is alert and oriented to person, place, and time. Mental status is at baseline.     ED Results / Procedures / Treatments   Labs (all labs ordered are listed, but only abnormal results are displayed) Labs Reviewed - No data to display  EKG None  Radiology DG Shoulder Left  Result Date: 04/13/2022 CLINICAL DATA:  Left shoulder pain following fall. EXAM: LEFT SHOULDER - 2+ VIEW COMPARISON:  None Available. FINDINGS: There is no  evidence of fracture or dislocation. Mild degenerative changes are present at the glenohumeral and acromioclavicular joints. Soft tissues are unremarkable. IMPRESSION: No acute fracture or dislocation. Electronically Signed   By: Brett Fairy M.D.   On: 04/13/2022 20:56   DG Knee Complete 4 Views Right  Result Date: 04/13/2022 CLINICAL DATA:  Right knee pain following fall. EXAM: RIGHT KNEE - COMPLETE 4+ VIEW COMPARISON:  09/24/2017. FINDINGS: No acute fracture or dislocation. Tricompartmental degenerative changes are noted and severe in the patellofemoral compartment. There is a moderate suprapatellar joint effusion. The soft tissues are within normal limits. IMPRESSION: 1. No acute fracture or dislocation. 2. Tricompartmental degenerative changes. 3. Moderate suprapatellar joint effusion. Electronically Signed   By: Brett Fairy M.D.   On: 04/13/2022 20:55    Procedures Procedures    Medications Ordered in ED Medications  oxyCODONE-acetaminophen (PERCOCET/ROXICET) 5-325 MG per tablet 1 tablet (1 tablet Oral Given 04/13/22 2147)    ED Course/ Medical Decision Making/ A&P                           Medical Decision Making Amount and/or Complexity of Data Reviewed Radiology: ordered.  Risk Prescription drug management.    Patient is a 46 year old female she states that she fell down 2 steps 3 days ago and states that her right knee twisted weirdly.  She has had some swelling in her right knee since.  She did not strike her head or lose consciousness no nausea or vomiting.  She states she also had some left shoulder pain which is significantly improved.  No other associated symptoms.  She is taken Tylenol today without significant relief.  She also was taking antihistamines which she thought would help with her pain.  Positive effusion in right knee on exam.  Will place in knee immobilizer and provide crutches.  Tylenol Voltaren gel Robaxin.  I personally viewed x-ray of right knee and  left shoulder no acute fractures.  We will discharge home with knee immobilizer and follow-up with orthopedics.  Final Clinical Impression(s) / ED Diagnoses Final diagnoses:  Fall, initial encounter    Rx / DC Orders ED Discharge Orders          Ordered    diclofenac Sodium (VOLTAREN) 1 % GEL  4 times daily        04/13/22 2145    acetaminophen (TYLENOL) 500 MG tablet  Every 6 hours PRN        04/13/22 2145    methocarbamol (ROBAXIN) 500 MG tablet  2 times daily        04/13/22 2145  Gailen Shelter, [place] 04/13/22 2153    Virgina Norfolk, DO 04/13/22 2229

## 2022-04-13 NOTE — Discharge Instructions (Signed)
You have a effusion/some fluid in your right knee this is likely from the injury on Thursday.  Ice elevate your knee and use the knee immobilizer and crutches until you follow-up with an orthopedist.  Tylenol 1000 mg every 6 hours, you may use muscle relaxer Robaxin as an additional pain medicine and Voltaren gel which I prescribed you.

## 2023-02-25 ENCOUNTER — Encounter: Payer: Self-pay | Admitting: Internal Medicine

## 2023-02-25 ENCOUNTER — Ambulatory Visit: Payer: 59 | Admitting: Internal Medicine

## 2023-02-25 VITALS — BP 110/80 | HR 77 | Temp 97.7°F | Resp 20 | Ht 66.0 in

## 2023-02-25 DIAGNOSIS — J302 Other seasonal allergic rhinitis: Secondary | ICD-10-CM | POA: Diagnosis not present

## 2023-02-25 DIAGNOSIS — J3089 Other allergic rhinitis: Secondary | ICD-10-CM | POA: Diagnosis not present

## 2023-02-25 DIAGNOSIS — Z91040 Latex allergy status: Secondary | ICD-10-CM | POA: Diagnosis not present

## 2023-02-25 MED ORDER — OLOPATADINE HCL 0.2 % OP SOLN: 1.0000 [drp] | Freq: Every day | OPHTHALMIC | 3 refills | Status: AC | PRN

## 2023-02-25 MED ORDER — FLUTICASONE PROPIONATE 50 MCG/ACT NA SUSP: 1.0000 | Freq: Every day | NASAL | 2 refills | Status: AC

## 2023-02-25 MED ORDER — MONTELUKAST SODIUM 10 MG PO TABS: 10.0000 mg | ORAL_TABLET | Freq: Every day | ORAL | 1 refills | Status: AC

## 2023-02-25 MED ORDER — AZELASTINE HCL 0.1 % NA SOLN: 2.0000 | Freq: Two times a day (BID) | NASAL | 12 refills | Status: AC

## 2023-02-25 NOTE — Patient Instructions (Signed)
Chronic Rhinitis Seasonal Allergic: - allergy testing today: deferred due to recent AH use, please schedule follow up skin test. Need to hold cetirizine/zyrtec for 3 days prior   - Prevention:  - allergen avoidance when possible - consider allergy shots as long term control of your symptoms by teaching your immune system to be more tolerant of your allergy triggers  - Symptom control: - Start Nasal Steroid Spray: Best results if used daily. - Options include Flonase (fluticasone), Nasocort (triamcinolone), Nasonex (mometasome) 1- 2 sprays in each nostril daily.  - All can be purchased over-the-counter if not covered by insurance. - Start Astelin (azelastine) 1-2 sprays in each nostril twice a day as needed for nasal congestion/itchy nose -- Start Singulair (Montelukast) 10mg  nightly.   - Discontinue if nightmares of behavior changes. - Continue Antihistamine: daily or daily as needed.   -Options include Zyrtec (Cetirizine) 10mg , Claritin (Loratadine) 10mg , Allegra (Fexofenadine) 180mg , or Xyzal (Levocetirinze) 5mg  - Can be purchased over-the-counter if not covered by insurance.  Allergic Conjunctivitis:  - Start Allergy Eye drops-great options include Pataday (Olopatadine) or Zaditor (ketotifen) for eye symptoms daily as needed-both sold over the counter if not covered by insurance. and Rewetting Drops such as Systane,TheraTears, etc  -Avoid eye drops that say red eye relief as they may contain medications that dry out your eyes.   Follow up: for skin testing   Thank you so much for letting me partake in your care today.  Don't hesitate to reach out if you have any additional concerns!  Ferol Luz, MD  [facility] [place], [place]

## 2023-02-25 NOTE — Progress Notes (Unsigned)
New Patient Note  RE: Kamori Feeser MRN: [id number] DOB: [birth year] Date of Office Visit: 02/25/2023  Consult requested by: [facility] Primary care provider: Norm Salt, [place]  Chief Complaint: Allergic Rhinitis  (Itchy eyes,skin, breaks out with hives with latex, detergent)  History of Present Illness: I had the pleasure of seeing Arabell Hovorka for initial evaluation at the [facility] on 02/26/2023. She is a 47 y.o. female, who is referred here by Norm Salt, PA for the evaluation of rhinitis, urticaria .  History obtained from patient, chart review.  Chronic rhinitis: started as a young child worsens over the past few years  Symptoms include: nasal congestion, rhinorrhea, post nasal drainage, sneezing, watery eyes, itchy eyes, and itchy nose  Occurs seasonally-spring and summer  Potential triggers: pollen, dust, female dogs cause urticaria  Treatments tried: benadryl and cetirizine  Previous allergy testing: yes History of reflux/heartburn: no History of chronic sinusitis or sinus surgery:  tonsillectomy  Nonallergic triggers:  denies      Latex exposure causes burning and itching     Paprika causes diffuse pruritus and mouth itching   Assessment and Plan: Edlyn is a 47 y.o. female with: Other allergic rhinitis  Seasonal and perennial allergic rhinitis  Latex allergy   Plan: Patient Instructions  Chronic Rhinitis Seasonal Allergic: - allergy testing today: deferred due to recent AH use, please schedule follow up skin test. Need to hold cetirizine/zyrtec for 3 days prior   - Prevention:  - allergen avoidance when possible - consider allergy shots as long term control of your symptoms by teaching your immune system to be more tolerant of your allergy triggers  - Symptom control: - Start Nasal Steroid Spray: Best results if used daily. - Options include Flonase (fluticasone), Nasocort (triamcinolone), Nasonex (mometasome)  1- 2 sprays in each nostril daily.  - All can be purchased over-the-counter if not covered by insurance. - Start Astelin (azelastine) 1-2 sprays in each nostril twice a day as needed for nasal congestion/itchy nose -- Start Singulair (Montelukast) 10mg  nightly.   - Discontinue if nightmares of behavior changes. - Continue Antihistamine: daily or daily as needed.   -Options include Zyrtec (Cetirizine) 10mg , Claritin (Loratadine) 10mg , Allegra (Fexofenadine) 180mg , or Xyzal (Levocetirinze) 5mg  - Can be purchased over-the-counter if not covered by insurance.  Allergic Conjunctivitis:  - Start Allergy Eye drops-great options include Pataday (Olopatadine) or Zaditor (ketotifen) for eye symptoms daily as needed-both sold over the counter if not covered by insurance. and Rewetting Drops such as Systane,TheraTears, etc  -Avoid eye drops that say red eye relief as they may contain medications that dry out your eyes.   Follow up: for skin testing   Thank you so much for letting me partake in your care today.  Don't hesitate to reach out if you have any additional concerns!  Ferol Luz, MD  [facility] [place]   Meds ordered this encounter  Medications   Olopatadine HCl 0.2 % SOLN    Sig: Apply 1 drop to eye daily as needed.    Dispense:  2.5 mL    Refill:  3   fluticasone (FLONASE) 50 MCG/ACT nasal spray    Sig: Place 1 spray into both nostrils daily.    Dispense:  16 g    Refill:  2   azelastine (ASTELIN) 0.1 % nasal spray    Sig: Place 2 sprays into both nostrils 2 (two) times daily. Use in each nostril  as directed    Dispense:  30 mL    Refill:  12   montelukast (SINGULAIR) 10 MG tablet    Sig: Take 1 tablet (10 mg total) by mouth at bedtime.    Dispense:  90 tablet    Refill:  1   Lab Orders  No laboratory test(s) ordered today    Other allergy screening: Asthma: no Rhino conjunctivitis: yes Food allergy: no Medication allergy: yes Hymenoptera  allergy: no Urticaria: yes Eczema:no History of recurrent infections suggestive of immunodeficency: no  Diagnostics: Skin Testing: Deferred due to recent antihistamines use.    Past Medical History: Patient Active Problem List   Diagnosis Date Noted   Major depressive disorder, recurrent episode, moderate (HCC) 07/14/2018   GAD (generalized anxiety disorder) 07/14/2018   Bilateral knee pain 10/08/2017   Protein-calorie malnutrition (HCC) 02/10/2015   S/P bariatric surgery 02/10/2015   Morbid obesity due to excess calories (HCC) 09/21/2014   History reviewed. No pertinent past medical history. Past Surgical History: Past Surgical History:  Procedure Laterality Date   CESAREAN SECTION     LAPAROSCOPIC GASTRIC SLEEVE RESECTION     TUBAL LIGATION     Medication List:  Current Outpatient Medications  Medication Sig Dispense Refill   acetaminophen (TYLENOL) 500 MG tablet Take 2 tablets (1,000 mg total) by mouth every 6 (six) hours as needed. 30 tablet 0   azelastine (ASTELIN) 0.1 % nasal spray Place 2 sprays into both nostrils 2 (two) times daily. Use in each nostril as directed 30 mL 12   cyclobenzaprine (FLEXERIL) 10 MG tablet Take 1 tablet (10 mg total) by mouth 2 (two) times daily as needed for muscle spasms. 10 tablet 0   fluticasone (FLONASE) 50 MCG/ACT nasal spray Place 1 spray into both nostrils daily. 16 g 2   montelukast (SINGULAIR) 10 MG tablet Take 1 tablet (10 mg total) by mouth at bedtime. 90 tablet 1   Olopatadine HCl 0.2 % SOLN Apply 1 drop to eye daily as needed. 2.5 mL 3   phentermine 37.5 MG capsule Take 37.5 mg by mouth every morning.     topiramate (TOPAMAX) 50 MG tablet Take by mouth.     No current facility-administered medications for this visit.   Allergies: Allergies  Allergen Reactions   Codeine     hives   Latex     rash   Nsaids Other (See Comments)    Gastric sleeve   Social History: Social History   Socioeconomic History   Marital  status: Single    Spouse name: Not on file   Number of children: Not on file   Years of education: Not on file   Highest education level: Not on file  Occupational History   Not on file  Tobacco Use   Smoking status: Never   Smokeless tobacco: Never  Vaping Use   Vaping Use: Never used  Substance and Sexual Activity   Alcohol use: Not Currently   Drug use: No   Sexual activity: Not Currently    Partners: Male    Birth control/protection: Surgical  Other Topics Concern   Not on file  Social History Narrative   Not on file   Social Determinants of Health   Financial Resource Strain: Not on file  Food Insecurity: Not on file  Transportation Needs: Not on file  Physical Activity: Not on file  Stress: Not on file  Social Connections: Not on file   Lives in a single-family home is 45 years old.  There are no roaches in the house that is 2 feet on the floor.  No dust mite precautions.  Not exposed to fumes, chemicals or dust.   Smoking: No exposure Occupation: Works as a Teacher, early years/pre for [employer] History: Immunologist in the house: no Engineer, civil (consulting) in the family room: yes Carpet in the bedroom: yes Heating: electric Cooling: central Pet: yes 3 dogs with access to bedroom  Family History: History reviewed. No pertinent family history.   ROS: All others negative except as noted per HPI.   Objective: BP 110/80 (BP Location: Right Wrist, Patient Position: Sitting, Cuff Size: Normal)   Pulse 77   Temp 97.7 F (36.5 C) (Temporal)   Resp 20   Ht 5\' 6"  (1.676 m)   SpO2 99%   BMI 35.99 kg/m  Body mass index is 35.99 kg/m.  General Appearance:  Alert, cooperative, no distress, appears stated age  Head:  Normocephalic, without obvious abnormality, atraumatic  Eyes:  Conjunctiva clear, EOM's intact  Nose: Nares normal,  erythematous nasal mucosa, hypertrophic turbinates, no visible anterior polyps, and septum midline  Throat: Lips, tongue normal; teeth and  gums normal, + cobblestoning  Neck: Supple, symmetrical  Lungs:   clear to auscultation bilaterally, Respirations unlabored, no coughing  Heart:  regular rate and rhythm and no murmur, Appears well perfused  Extremities: No edema  Skin: Skin color, texture, turgor normal, no rashes or lesions on visualized portions of skin  Neurologic: No gross deficits   The plan was reviewed with the patient/family, and all questions/concerned were addressed.  It was my pleasure to see Dejanay today and participate in her care. Please feel free to contact me with any questions or concerns.  Sincerely,  Ferol Luz, MD Allergy & Immunology  [facility] office: [phone] [facility] office: [phone]

## 2023-08-25 ENCOUNTER — Emergency Department (HOSPITAL_BASED_OUTPATIENT_CLINIC_OR_DEPARTMENT_OTHER)
Admission: EM | Admit: 2023-08-25 | Discharge: 2023-08-25 | Disposition: A | Discharge status: Home / Self Care | Payer: 59 | Attending: Emergency Medicine | Admitting: Emergency Medicine

## 2023-08-25 ENCOUNTER — Encounter (HOSPITAL_BASED_OUTPATIENT_CLINIC_OR_DEPARTMENT_OTHER): Payer: Self-pay

## 2023-08-25 ENCOUNTER — Other Ambulatory Visit: Payer: Self-pay

## 2023-08-25 DIAGNOSIS — Z20822 Contact with and (suspected) exposure to covid-19: Secondary | ICD-10-CM | POA: Insufficient documentation

## 2023-08-25 DIAGNOSIS — M791 Myalgia, unspecified site: Secondary | ICD-10-CM | POA: Insufficient documentation

## 2023-08-25 DIAGNOSIS — R519 Headache, unspecified: Secondary | ICD-10-CM | POA: Insufficient documentation

## 2023-08-25 DIAGNOSIS — Z5321 Procedure and treatment not carried out due to patient leaving prior to being seen by health care provider: Secondary | ICD-10-CM | POA: Insufficient documentation

## 2023-08-25 DIAGNOSIS — R059 Cough, unspecified: Secondary | ICD-10-CM | POA: Insufficient documentation

## 2023-08-25 DIAGNOSIS — R0981 Nasal congestion: Secondary | ICD-10-CM | POA: Diagnosis not present

## 2023-08-25 LAB — RESP PANEL BY RT-PCR (RSV, FLU A&B, COVID)  RVPGX2
Influenza A by PCR: NEGATIVE
Influenza B by PCR: NEGATIVE
Resp Syncytial Virus by PCR: NEGATIVE
SARS Coronavirus 2 by RT PCR: NEGATIVE

## 2023-08-25 NOTE — ED Triage Notes (Signed)
Pt arrives with c/o cough, congestion, bodyache, and headache that started a few days ago. Pt denies fevers.
